# Patient Record
Sex: Female | Born: 1955 | Race: Black or African American | Hispanic: No | Marital: Single | Smoking: Former smoker
Health system: Southern US, Community
[De-identification: ages and names within clinical notes are randomized; demographics above are authoritative.]

## PROBLEM LIST (undated history)

## (undated) DIAGNOSIS — I429 Cardiomyopathy, unspecified: Secondary | ICD-10-CM

## (undated) DIAGNOSIS — K08109 Complete loss of teeth, unspecified cause, unspecified class: Secondary | ICD-10-CM

## (undated) DIAGNOSIS — E0590 Thyrotoxicosis, unspecified without thyrotoxic crisis or storm: Secondary | ICD-10-CM

## (undated) DIAGNOSIS — E1169 Type 2 diabetes mellitus with other specified complication: Secondary | ICD-10-CM

## (undated) DIAGNOSIS — D649 Anemia, unspecified: Secondary | ICD-10-CM

## (undated) DIAGNOSIS — I10 Essential (primary) hypertension: Secondary | ICD-10-CM

## (undated) DIAGNOSIS — N186 End stage renal disease: Secondary | ICD-10-CM

## (undated) DIAGNOSIS — N2581 Secondary hyperparathyroidism of renal origin: Secondary | ICD-10-CM

## (undated) DIAGNOSIS — E119 Type 2 diabetes mellitus without complications: Secondary | ICD-10-CM

## (undated) DIAGNOSIS — B1920 Unspecified viral hepatitis C without hepatic coma: Secondary | ICD-10-CM

## (undated) DIAGNOSIS — I770 Arteriovenous fistula, acquired: Secondary | ICD-10-CM

## (undated) DIAGNOSIS — E669 Obesity, unspecified: Secondary | ICD-10-CM

## (undated) DIAGNOSIS — Z972 Presence of dental prosthetic device (complete) (partial): Secondary | ICD-10-CM

## (undated) HISTORY — DX: Hypercalcemia: E83.52

## (undated) HISTORY — DX: Thyrotoxicosis, unspecified without thyrotoxic crisis or storm: E05.90

---

## 1998-05-28 ENCOUNTER — Inpatient Hospital Stay (HOSPITAL_COMMUNITY): Admission: EM | Admit: 1998-05-28 | Discharge: 1998-06-05 | Payer: Self-pay | Admitting: *Deleted

## 1998-05-28 ENCOUNTER — Encounter: Payer: Self-pay | Admitting: *Deleted

## 1998-05-29 ENCOUNTER — Encounter: Payer: Self-pay | Admitting: Infectious Diseases

## 1998-06-03 ENCOUNTER — Encounter: Payer: Self-pay | Admitting: Infectious Diseases

## 1998-06-11 DIAGNOSIS — I427 Cardiomyopathy due to drug and external agent: Secondary | ICD-10-CM | POA: Insufficient documentation

## 1998-06-23 DIAGNOSIS — F1920 Other psychoactive substance dependence, uncomplicated: Secondary | ICD-10-CM | POA: Insufficient documentation

## 1998-07-13 ENCOUNTER — Encounter: Admission: RE | Admit: 1998-07-13 | Discharge: 1998-07-13 | Payer: Self-pay | Admitting: Internal Medicine

## 1998-11-25 ENCOUNTER — Ambulatory Visit (HOSPITAL_COMMUNITY): Admission: RE | Admit: 1998-11-25 | Discharge: 1998-11-25 | Payer: Self-pay | Admitting: Nephrology

## 1998-11-25 ENCOUNTER — Encounter: Payer: Self-pay | Admitting: Nephrology

## 1998-12-02 ENCOUNTER — Ambulatory Visit (HOSPITAL_COMMUNITY): Admission: RE | Admit: 1998-12-02 | Discharge: 1998-12-02 | Payer: Self-pay | Admitting: Vascular Surgery

## 1999-04-28 ENCOUNTER — Ambulatory Visit (HOSPITAL_COMMUNITY): Admission: RE | Admit: 1999-04-28 | Discharge: 1999-04-28 | Payer: Self-pay | Admitting: Nephrology

## 1999-04-28 ENCOUNTER — Encounter: Payer: Self-pay | Admitting: Nephrology

## 2001-08-15 HISTORY — PX: CHOLECYSTECTOMY: SHX55

## 2002-08-02 ENCOUNTER — Inpatient Hospital Stay (HOSPITAL_COMMUNITY): Admission: RE | Admit: 2002-08-02 | Discharge: 2002-08-04 | Payer: Self-pay | Admitting: General Surgery

## 2002-08-02 ENCOUNTER — Encounter: Payer: Self-pay | Admitting: General Surgery

## 2002-08-02 ENCOUNTER — Encounter (INDEPENDENT_AMBULATORY_CARE_PROVIDER_SITE_OTHER): Payer: Self-pay | Admitting: *Deleted

## 2004-10-04 ENCOUNTER — Ambulatory Visit (HOSPITAL_COMMUNITY): Admission: RE | Admit: 2004-10-04 | Discharge: 2004-10-04 | Payer: Self-pay | Admitting: Nephrology

## 2005-06-06 ENCOUNTER — Ambulatory Visit: Payer: Self-pay

## 2005-08-15 HISTORY — PX: INCISIONAL HERNIA REPAIR: SHX193

## 2005-08-25 ENCOUNTER — Inpatient Hospital Stay (HOSPITAL_COMMUNITY): Admission: RE | Admit: 2005-08-25 | Discharge: 2005-09-03 | Payer: Self-pay | Admitting: General Surgery

## 2006-08-15 HISTORY — PX: AV FISTULA PLACEMENT: SHX1204

## 2006-08-15 HISTORY — PX: AV FISTULA REPAIR: SHX563

## 2006-12-30 ENCOUNTER — Ambulatory Visit: Payer: Self-pay | Admitting: *Deleted

## 2006-12-30 ENCOUNTER — Ambulatory Visit (HOSPITAL_COMMUNITY): Admission: RE | Admit: 2006-12-30 | Discharge: 2006-12-30 | Payer: Self-pay | Admitting: Nephrology

## 2007-02-09 ENCOUNTER — Ambulatory Visit: Payer: Self-pay | Admitting: Vascular Surgery

## 2007-02-14 ENCOUNTER — Ambulatory Visit: Payer: Self-pay | Admitting: Vascular Surgery

## 2007-02-14 ENCOUNTER — Ambulatory Visit (HOSPITAL_COMMUNITY): Admission: RE | Admit: 2007-02-14 | Discharge: 2007-02-14 | Payer: Self-pay | Admitting: Vascular Surgery

## 2007-04-25 ENCOUNTER — Ambulatory Visit: Payer: Self-pay | Admitting: Vascular Surgery

## 2007-05-07 ENCOUNTER — Ambulatory Visit (HOSPITAL_COMMUNITY): Admission: RE | Admit: 2007-05-07 | Discharge: 2007-05-07 | Payer: Self-pay | Admitting: Vascular Surgery

## 2007-05-08 ENCOUNTER — Ambulatory Visit: Payer: Self-pay | Admitting: Vascular Surgery

## 2007-08-01 ENCOUNTER — Ambulatory Visit: Payer: Self-pay | Admitting: Vascular Surgery

## 2007-08-01 ENCOUNTER — Ambulatory Visit (HOSPITAL_COMMUNITY): Admission: RE | Admit: 2007-08-01 | Discharge: 2007-08-01 | Payer: Self-pay | Admitting: Nephrology

## 2007-08-01 ENCOUNTER — Ambulatory Visit (HOSPITAL_COMMUNITY): Admission: RE | Admit: 2007-08-01 | Discharge: 2007-08-01 | Payer: Self-pay | Admitting: Vascular Surgery

## 2007-09-21 ENCOUNTER — Ambulatory Visit (HOSPITAL_COMMUNITY): Admission: RE | Admit: 2007-09-21 | Discharge: 2007-09-21 | Payer: Self-pay | Admitting: Nephrology

## 2007-10-19 ENCOUNTER — Emergency Department: Payer: Self-pay | Admitting: Emergency Medicine

## 2007-10-20 ENCOUNTER — Emergency Department (HOSPITAL_COMMUNITY): Admission: EM | Admit: 2007-10-20 | Discharge: 2007-10-20 | Payer: Self-pay | Admitting: Emergency Medicine

## 2007-10-23 ENCOUNTER — Inpatient Hospital Stay (HOSPITAL_COMMUNITY): Admission: EM | Admit: 2007-10-23 | Discharge: 2007-10-25 | Payer: Self-pay | Admitting: Emergency Medicine

## 2007-11-07 ENCOUNTER — Ambulatory Visit: Payer: Self-pay | Admitting: Vascular Surgery

## 2008-01-28 ENCOUNTER — Emergency Department: Payer: Self-pay | Admitting: Emergency Medicine

## 2008-04-23 ENCOUNTER — Ambulatory Visit: Payer: Self-pay | Admitting: Vascular Surgery

## 2008-05-19 ENCOUNTER — Encounter: Payer: Self-pay | Admitting: Vascular Surgery

## 2008-05-19 ENCOUNTER — Ambulatory Visit: Payer: Self-pay | Admitting: Vascular Surgery

## 2008-05-19 ENCOUNTER — Ambulatory Visit (HOSPITAL_COMMUNITY): Admission: RE | Admit: 2008-05-19 | Discharge: 2008-05-19 | Payer: Self-pay | Admitting: Vascular Surgery

## 2008-10-02 ENCOUNTER — Ambulatory Visit (HOSPITAL_COMMUNITY): Admission: RE | Admit: 2008-10-02 | Discharge: 2008-10-02 | Payer: Self-pay | Admitting: Nephrology

## 2008-10-25 ENCOUNTER — Ambulatory Visit: Payer: Self-pay | Admitting: Nephrology

## 2008-11-14 ENCOUNTER — Emergency Department: Payer: Self-pay | Admitting: Emergency Medicine

## 2008-12-23 ENCOUNTER — Ambulatory Visit: Payer: Self-pay | Admitting: Internal Medicine

## 2009-02-25 ENCOUNTER — Emergency Department: Payer: Self-pay | Admitting: Emergency Medicine

## 2010-02-24 ENCOUNTER — Ambulatory Visit (HOSPITAL_COMMUNITY): Admission: RE | Admit: 2010-02-24 | Discharge: 2010-02-24 | Payer: Self-pay | Admitting: Nephrology

## 2010-09-05 ENCOUNTER — Encounter: Payer: Self-pay | Admitting: Nephrology

## 2010-11-30 LAB — POTASSIUM: Potassium: 4.5 mEq/L (ref 3.5–5.1)

## 2010-12-28 NOTE — Op Note (Signed)
[name], [name]                ACCOUNT NO.:  [id number]   MEDICAL RECORD NO.:  [id number]          PATIENT TYPE:  AMB   LOCATION:  SDS                          FACILITY:  [facility]   PHYSICIAN:  Charles E. Fields, MD  DATE OF BIRTH:  [birth year]   DATE OF PROCEDURE:  02/14/2007  DATE OF DISCHARGE:  02/14/2007                               OPERATIVE REPORT   PROCEDURE:  Right brachiocephalic AV fistula.   PREOPERATIVE DIAGNOSIS:  End-stage renal disease.   POSTOPERATIVE DIAGNOSIS:  End-stage renal disease.   ANESTHESIA:  Local with IV sedation.   OPERATIVE FINDINGS:  3 mm right cephalic vein.   OPERATIVE DETAILS:  After obtaining informed consent, the patient was  taken to the operating room.  The patient was placed in the supine  position on the operating room table.  After adequate sedation, the  patient's right arm was inspected at the wrist to look at the cephalic  vein.  Previous vein mapping ultrasound had shown that the cephalic vein  at the wrist was tortuous and very posterior.  I confirmed this in the  OR and thought that the use of this vein would be inadequate; it was  also of marginal size.  At this point the entire right upper extremity  was prepped and draped in the usual sterile fashion.  Local anesthesia  was infiltrated near the antecubital crease.  A transverse incision was  made in this location and carried down through the subcutaneous tissues,  down to the level of the cephalic vein.  The cephalic vein was dissected  free circumferentially, and small side branches ligated and divided  between silk ties.  The brachial artery was then dissected free in the  medial portion incision.  Vessel loops were placed proximal and distal  around this.  The patient was then given 5000 units of intravenous  heparin.  The cephalic vein was ligated with a 3-0 silk tie, thoroughly  flushed with heparinized saline and gently distended.  It was then swung  over the level of the  brachial artery.  The  brachial artery was  controlled proximally and distal with vessel loops.  Longitudinal  arteriotomy was made.  The vein was then sewn end-of-vein to side-of-  artery using a running 7-0 Prolene suture.  Just prior to completion of  anastomosis, it was forebled backbled and thoroughly flushed.  Anastomosis was secured.  Vessel loops were released; there was palpable  thrill in the fistula immediately.   Next, hemostasis was obtained.  Subcutaneous tissues were reapproximated  using running 3-0 Vicryl suture.  Skin was closed with a 4-0 Vicryl  subcuticular stitch.  The patient tolerated the procedure well and there  were no complications.  Instrument, sponge and needle counts were  correct at the end of the case.  The patient was taken to the recovery  room in stable condition.      Jessy Oto. Fields, MD  Electronically Signed     CEF/MEDQ  D:  02/14/2007  T:  02/14/2007  Job:  [id number]

## 2010-12-28 NOTE — Procedures (Signed)
CEPHALIC VEIN MAPPING   INDICATION:  Evaluation of right cephalic vein prior to placement of  dialysis access.   HISTORY:  End-stage renal disease, multiple dialysis access procedures in the left  arm.   EXAM:  Duplex right arm cephalic vein.   The right cephalic vein is compressible.   Diameter measurements range from 0.3 to 0.6 cm.   The left cephalic vein was not evaluated.   IMPRESSION:  Patent right cephalic vein which is of acceptable diameter  for use as a dialysis access site.   The distal forearm portion of the right cephalic vein courses around the  radial aspect of the forearm and continues posteriorly towards the hand.    _______________________  Ruta Hinds, M.D.   ___________________________________________  Jessy Oto. Fields, MD   MC/MEDQ  D:  02/09/2007  T:  02/09/2007  Job:  [id number]

## 2010-12-28 NOTE — Discharge Summary (Signed)
Anna [name], Anna [name]                ACCOUNT NO.:  [id number]   MEDICAL RECORD NO.:  [id number]          PATIENT TYPE:  INP   LOCATION:  [id number]                         FACILITY:  [facility]   PHYSICIAN:  Donato Heinz, M.D.DATE OF BIRTH:  Nov 18, 1955   DATE OF ADMISSION:  10/23/2007  DATE OF DISCHARGE:  10/25/2007                               DISCHARGE SUMMARY   CHIEF COMPLAINT:  Shortness of breath.   DISCHARGE ACTIVITIES:  1. Shortness of breath, secondary to resolving pneumonia plus volume      overload.  2. End-stage renal disease on hemodialysis, dialysis Tuesday,      Thursday, and Saturday.  3. Right lobe pneumonia, resolving.  4. Hypertension.  5. Hepatitis C.  6. Anemia of chronic disease.  7. History of polysubstance abuse, crack cocaine.  8. Secondary hyperparathyroidism.  9. History of cardiomegaly secondary to hypertension with ejection      fraction of 45-50%.  10.Hemodialysis.  11.Questionable diabetes.   DISCHARGE MEDICATIONS:  1. PhosLo 3 tablets t.i.d. before meals.  2. Aranesp nightly.  3. Nephro-Vite daily.   DISPOSITION:  The patient will follow up with [facility]  and continue with the Tuesday, Thursday, and Saturday hemodialysis as  scheduled.  Records were faxed over to the [facility],  phone number is [phone].   STUDIES:  None.   CULTURES:  None.   CONSULTS:  None.   BRIEF HISTORY AND PHYSICAL:  [name] is a 55 year old African  American female with end-stage renal disease on HD at [facility] in  [place] who had pneumonia diagnosed in October 19, 2007, which was 56  days prior to admission.  The patient was started on azithromycin and  discharged home for this right lower lobe pneumonia.  The patient then  returned to [facility] on Saturday, which is her normal  dialysis day, two days prior to admission, because she wanted a second  opinion of this pneumonia diagnosis.  The patient's initial  presentation  was at [facility].  However, when [facility] requested  records from [facility], the patient was forced to wait.  However, these  records were obtained and decided  in this waiting time, she did not  feel it was necessary to get the second opinionafter all and left the ED  AMA.  The patient's next dialysis appointment was on the morning of  admission and normally dialyzed at around 6:30 a.m.  The patient felt  that because of her underlying shortness of breath, she could not get  ready quickly enough to make the 6:30 dialysis appointment, therefore  she missed her dialysis.  She at this point  had missed 2 dialysis  treatments.  She contacted the dialysis center.  They told her to report  to [facility] for dialysis.  The patient reported fevers, shortness of  breath, and right-sided chest pain initially when diagnosed with this  pneumonia and reports on the day of admission to the hospital shortness  of breath had improved but still present.  Right side chest pain was  still present as well, mostly pleuritic  in nature and fevers had  resolved.  The patient completed her antibiotic course on the day of  admission.  The patient had no other complaints.   PAST MEDICAL HISTORY:  As dictated above.   ADMISSION MEDICATIONS:  As dictated above.   ALLERGIES:  None.   REVIEW OF SYSTEMS:  Negative except as noted in the HPI.   ADMISSION PHYSICAL AND VITALS:  Please see dictated H&P on October 23, 2007.   HOSPITAL COURSE:  1. Shortness of breath.  I felt this shortness of breath was likely      secondary to end-stage renal disease resulting in a volume overload      as well as resolving right-sided pneumonia.  Upon presentation to      [facility] the patient was no longer febrile and had a      white count checked, which was normal at 8.8.  We do not feel there      is any need to repeat the chest x-ray and since the patient had no      further clinical  symptoms of recurrent infection, I do not feel it      is necessary to place the patient on antibiotics.  We went ahead      and dialyzed the patient, and her shortness of breath improved      some.  On the night following admission, the patient had some      right-sided back pain, which was different from the pleuritic pain      she was having that was most likely positional; however, a chest x-      ray was checked which showed bilateral pleural effusions.  No      infiltrate was noticed.  Cardiac enzymes were checked x1 and were      negative, and the EKG was checked, which showed a sinus tachycardia      with no acute changes.  This x-ray reinforced the above belief that      this shortness of breath was likely secondary to the severe      albuminemia as well as fluid overload.  The patient was offered an      extra dialysis treatment on hospital day #1 and refused, therefore      we decided to keep her and night watch her and dialyze her again on      hospital day #2.  On hospital day #2 after receiving dialysis, the      patient's shortness of breath had resolved, and the patient felt      that she was back to her baseline before discharge.2.  End stage      renal disease.  The patient had missed 2 dialysis treatments prior      to admission and seemed to be slightly volume overloaded; however,      the patient was below her dry weight, which is 108 kilos.  On      admission, the patient was 106 kilos.  It was felt that patient was      in fact not dry, but she just lost weight over this juncture      possibly secondary to her newly diagnosed pneumonia.  The patient      underwent dialysis and was continued on her Tuesday, Thursday, and      Saturday schedule.  The patient would be discharged back to      [facility] to continue on this schedule.  There were      no complications with dialysis.  2. Pneumonia.  The patient was initially diagnosed with this pneumonia       4 days prior to admission, completed a 5-day course of azithromycin      and remained afebrile with normal white count throughout this      hospital course.  Chest x-ray checked during hospitalization showed      no new infiltrate.  3. Hypertension.  The patient was initially hypertensive with a blood      pressure of 160/100.  This was likely secondary to volume overload;      however, __________ the patient's systolic of less than [id number].  We      started the patient on Norvasc 5 mg at night which she had been on      the past; however, the patient refused to take this medication.      Following dialysis, patient's blood pressures were in the [id number]      systolic, therefore we decided to discontinue this medication prior      to discharge.  The patient will remain on no blood pressure      medications at the time of discharge.  4. Diabetes.  The patient has reported diabetes; however, blood sugars      have remained in the normal range, and the patient was not placed      on the insulin and will not be discharged on any.  5. Hepatitis C.  This problem was stable.  6. Anemia of chronic disease.  The patient's hemoglobin remained      stable throughout the hospital course, and she remained on EPO or      Aranesp while here at [facility].  She will continue on      EPO at the time of discharge.  7. Secondary hyperparathyroidism.  The patient will continue on PhosLo      as well as Nephro-Vite.   DISCHARGE LABORATORY AND VITAL SIGNS:  Temperature 98.5, blood pressure  117/78, pulse 86, respiratory rate 20, saturations 93% on room air.  Labs:  Electrolytes: Sodium 135, potassium 4.2, chloride 94, bicarb 25,  BUN 52, creatinine 16.5, glucose 139, calcium 9.6.  CBC: White count  8.3, hemoglobin 11.5, and platelets 285,000.      Lincoln Maxin, MD  Electronically Signed     ______________________________  Donato Heinz, M.D.    JP/MEDQ  D:  10/25/2007  T:  10/26/2007   Job:  [id number]

## 2010-12-28 NOTE — Op Note (Signed)
Anna [name], Anna [name]                ACCOUNT NO.:  [id number]   MEDICAL RECORD NO.:  [id number]          PATIENT TYPE:  AMB   LOCATION:  SDS                          FACILITY:  [facility]   PHYSICIAN:  Charles E. Fields, MD  DATE OF BIRTH:  1955-12-01   DATE OF PROCEDURE:  05/19/2008  DATE OF DISCHARGE:  05/19/2008                               OPERATIVE REPORT   PROCEDURE:  Excisional biopsy of right chest wall nodule.   PREOPERATIVE DIAGNOSIS:  Right-sided chest pain.   POSTOPERATIVE DIAGNOSIS:  Right-sided chest pain.   ANESTHESIA:  Local with IV sedation.   INDICATIONS:  The patient is a 55 year old female who previously had a  right-sided Diatek catheter and has developed a nodule scar-type lesion  on the right chest wall after the catheter has been removed.  She  reports that this nodule is painful to her.  She presents today for  excisional biopsy of this.   SPECIMENS:  Excisional biopsy of right chest wall.   OPERATIVE DETAIL:  After obtaining informed consent, the patient was  taken to the operating room.  The patient was placed in supine position  on the operating table.  After adequate sedation, the patient's entire  right anterior chest wall was prepped and draped in the usual sterile  fashion.  Local anesthesia was infiltrated around the nodule on the  right chest wall.  An elliptical incision was made around this nodule  and carried down through subcutaneous tissue down underneath the level  of the nodule.  This was then completely excised and sent to pathology  as a specimen.  This was transected in the central portion, and no  obvious foreign body was noticed.  Next, hemostasis was obtained with  cautery.  Subcutaneous tissues were reapproximated using running 3-0  Vicryl suture.  Skin was closed with a 4-0 Vicryl subcuticular stitch.  The patient tolerated the procedure well, and there were no  complications.  Instruments, sponge, and needle counts were correct at  the end of the case.  The patient was taken to the recovery room in  stable condition.      Jessy Oto. Fields, MD  Electronically Signed     CEF/MEDQ  D:  05/19/2008  T:  05/20/2008  Job:  [id number]

## 2010-12-28 NOTE — Assessment & Plan Note (Signed)
OFFICE VISIT   VALOIS, PALMITER A  DOB:  [birth year]                                       04/25/2007  [id number]   Patient returns for followup today after placement of a right  brachiocephalic AV fistula in July, 2008.   On exam today, the fistula has an easily palpable thrill.  She does have  an obese upper arm, but the proximal segment of this is easily palpable.   She has a known thrombosed left upper arm AV fistula and is requesting  today that we remove this so that the bulge in her arm is not so  prominent.  She is currently dialyzing by a right-sided catheter.  I  believe that they should be able to use the right-sided fistula at any  time.  I wrote her a note today regarding this.  As far as her  aneurysmal occluded fistula is concerned, I told her that it would take  several incisions to remove this, and certainly we could ligate the  fistula and remove as much of the old thrombus as possible.  She will  probably still be left with some scars in the arm, but she wishes to  have this removed as much as possible.  We have scheduled this for  May 02, 2007.   Jessy Oto. Fields, MD  Electronically Signed   CEF/MEDQ  D:  04/25/2007  T:  04/26/2007  Job:  [id number]   cc:   Maudie Flakes. Hassell Done, M.D.

## 2010-12-28 NOTE — Assessment & Plan Note (Signed)
OFFICE VISIT   Anna Anna [name], Anna Anna [name]  DOB:  1956/08/04                                       02/09/2007  [id number]   The patient is Anna [name] longterm hemodialysis access patient.  She has  previously been using the left upper arm AV fistula for the last 9  years.  This developed aneurysmal degeneration and eventually occluded.  She is currently using Anna [name] right-sided catheter.  She has been using this  for the last 3 weeks.  She is currently 55 years of age.  She is right-  handed.  She has fairly obese upper arms.   PHYSICAL EXAMINATION:  Blood pressure is 119/82, heart rate is 85 and  regular.  She has obese upper arms but there is Anna [name] cephalic vein at the  antecubital space.  She has 2+ brachial and radial pulses bilaterally.  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   MEDICATIONS:  1. PhosLo 660 mg three times Anna [name] day.  2. Rena-Vite once Anna [name] day.  3. Multivitamin once Anna [name] day.   States that she  had hypertension in the past but has not had any  recently.  She had Anna [name] vein mapping ultrasound today which showed that the  vein in her forearm is quite tortuous but approximately 3-mm in  diameter.  The upper arm vein is much straighter and varies between 3  and 6-mm in diameter.   I believe the patient might be suitable for Anna [name] radiocephalic AV fistula.  However this will depend on the lay of the vein and we will assess this  again at the time of operation.  We may actually explore this prior to  placing the fistula.  If the wrist is an unacceptable location we will  place Anna [name] brachiocephalic fistula at the same time.  We have scheduled her  fistula placement for February 14, 2007.   Jessy Oto. Fields, MD  Electronically Signed   CEF/MEDQ  D:  02/12/2007  T:  02/12/2007  Job:  [id number]   cc:   [facility]

## 2010-12-28 NOTE — H&P (Signed)
NAMEDENISS, FREUNDLICH NO.:  [id number]   MEDICAL RECORD NO.:  [id number]          PATIENT TYPE:  INP   LOCATION:  [id number]                         FACILITY:  [facility]   PHYSICIAN:  Lincoln Maxin, MD    DATE OF BIRTH:  November 14, 1955   DATE OF ADMISSION:  10/23/2007  DATE OF DISCHARGE:                              HISTORY & PHYSICAL   HISTORY OF PRESENT ILLNESS:  This 55 year old African American female  with end-stage renal disease on Tuesday, Thursday, Saturday hemodialysis  at [facility] with pneumonia diagnosed on October 19, 2007.  Patient is  presenting to the ED after missing 2 HC treatments secondary to  shortness of breath from pneumonia.  Symptoms started up March 6 with  shortness of breath, right-sided chest pain, rib pain and fever.  Patient had full work up at [facility] , sent home on  azithromycin and treated for 5 days.  Patient came to [facility] on March 7, one day after being diagnosed at [facility]  because she wanted a second opinion.  Patient arrived in the ED at  [facility] and was promptly seen; however, records show  [facility] requested, and patient reports that she was waiting for 3 hours  without receiving any kind of treatment.  Patient, however, had refused  chest x-ray and other work up.  Instead of waiting until those records  came, patient decided that the second opinion was no longer important  and left AMA from the ED.  Patient was, however, febrile; 101.4 when she  arrived in the ED at [facility].  Patient presents today in the ED and  feels that shortness of breath and right-sided pain are improved.  The  fevers have since resolved.  Patient does get shortness of breath when  moving around and doing activities.  Patient reports this morning she  normally dialyses at 6:30 in the morning.  However, because of this  continued shortness of breath, she was unable to get ready fast enough  to make the 6:30  appointment.  She called the dialysis center who  advised the patient to come to ED at [facility] for HD.  Patient was  without other complaint.  She completed her azithromycin course today  and feels as though the pneumonia is improving.   PAST MEDICAL HISTORY:  1. End-stage renal disease on hemodialysis at [facility]      Tuesday, Thursday, Saturday.  2. Pneumonia, resolving.  3. Hypertension.  4. Question of diabetes.  5. Hepatitis C.  6. Anemia of chronic disease.  7. History of polysubstance abuse.  8. Secondary hyperparathyroidism.  9. History of cardiomegaly secondary to hypertension with ejection      fraction of 45% to 50%.  10.Hemorrhoids.  11.Status post laparoscopic cholecystectomy  2003.   ADMISSION MEDICATIONS:  1. Phos-Lo 7 mg 3 tabs t.i.d. before meals.  2. Nephro-Vite 1 tab daily.   ALLERGIES:  No known drug allergies.   FAMILY HISTORY:  Noncontributory.   SOCIAL HISTORY:  Lives at home with her children.  Denies alcohol,  tobacco, or current  drug use.   REVIEW OF SYSTEMS:  Negative except for as noted in the HPI.   PHYSICAL EXAMINATION:  VITALS:  Temperature 98.2.  Pulse 85.  Respiration rate 18.  Blood pressure 160/101.  O2 91% on room air.  GENERAL:  Patient in no acute distress.  Alert and oriented x3.  EYES:  Pupils equally reactive to light and accommodation.  Extraocular  movements intact.  Anicteric.  ENT:  Oropharynx clear.  No pharyngeal erythema or exudate.  Moist  mucous membranes.  CARDIOVASCULAR:  Regular rate and rhythm.  No murmurs, rubs or gallops.  PULMONARY:  Diminished breath sounds throughout.  Patient refuses to  take deep inspiration secondary to right rib pain.  With inspiration,  however, no apparent wheezing, rubs, or rhonchi.  GASTROINTESTINAL:  Abdomen is very obese, however it is soft, nontender.  Positive bowel sounds.  There is an umbilical hernia scar.  EXTREMITIES:  No edema.  No clubbing.  NEUROLOGIC:  Cranial  nerves appear intact.  Patient is nonfocal.   No labs on admission; however, electrolytes and CBC were pending.   ASSESSMENT AND PLAN:  1. End-stage renal disease.  Patient will be admitted to the hospital      and receive dialysis.  We will continue her on a Tuesday, Thursday,      Saturday schedule.  2. Pneumonia.  Patient is status post azithromycin for 5 days.      Symptomatically improved.  No need to repeat the chest x-ray.  This      is likely still resolving.  Patient is afebrile.  White blood cell      count pending and her O2 Sats are greater than 90%.  No reason to      believe that pneumonia is persisting as all clinical markers point      to its resolution.  3. Hypertension.  Patient's sytolic blood pressure [id number].  She has a      history of hypertension, however is not on any medications on      admission.  Patient has been on lisinopril in the past, and her      goal systolic blood pressure is less than 140.  We will start her      on a low dose of Norvasc at night for improved blood pressure      control.  4. Diabetes.  Patient has a past medical history of reported diabetes;      however, she is unclear for this diagnosis and is on no medications      currently.  We will wait and obtain the patient's blood sugar from      her BMP.  Start the patient on a consistent carb diet.  5. Hepatitis C.  This is controlled.  LFT is pending.  6. Anemia of chronic disease.  Patient's hemoglobin pending.  7. History of polysubstance abuse.  Patient denies current use and has      history of crack cocaine use in the past.  8. Secondary hyperparathyroidism.  Patient  is on Phos-Lo and will      continue.      Lincoln Maxin, MD  Electronically Signed     JP/MEDQ  D:  10/23/2007  T:  10/23/2007  Job:  [id number]

## 2010-12-28 NOTE — Assessment & Plan Note (Signed)
OFFICE VISIT   Anna [name], Anna [name] A  DOB:  1956-07-08                                       04/23/2008  [id number]   The patient is a 55 year old female who currently is on hemodialysis on  Tuesday, Thursday, Saturday.  She was sent for evaluation today for pain  around an old Diatek site.  She has had no fever, chills or drainage  from this.  She stated that it was red recently but this has resolved.  She states she has had pain in this site for a year since her Diatek was  removed.   On physical exam there is a hypertrophic scar on her right anterior  chest wall.  There is no erythema.  No discharge.  This is tender to  palpation.  Of note, she also has a right brachiocephalic AV fistula.  They have been sticking a segment that is only 2 cm in length because  they said they have difficulty sticking it higher than this.  I did  counsel the patient today that if they continue to stick in the same  small area the fistula is not going to be very durable or last very  long.  Unfortunately her drain is fairly deep in the upper arm but it is  easily palpable on exam.  She states that certain techs are able to  cannulate it but others are not.  I have counseled her that she should  try to rotate the stick sites as much as possible.  If they are  continuing to have difficulty cannulating this then some consideration  should be made for converting it to a graft as this will not be a  durable fistula with these stick sites only occurring every 2 cm segment  of the proximal fistula.   As far as the area on her right anterior chest is concerned I do not see  any area of infection.  She has had pain in this area for greater than a  year.  I believe the best option would be to excise this area.  She may  have a small area of retained cuff.  However, I did counsel her today  that all of her pain symptoms may not resolve but this would probably be  the best option to  make sure she does not have any retained segments.  This is scheduled for Monday 04/28/2008.   Jessy Oto. Fields, MD  Electronically Signed   CEF/MEDQ  D:  04/23/2008  T:  04/25/2008  Job:  [id number]   cc:   [facility]

## 2010-12-28 NOTE — Op Note (Signed)
Anna [name], Anna [name]                ACCOUNT NO.:  [id number]   MEDICAL RECORD NO.:  [id number]          PATIENT TYPE:  AMB   LOCATION:  XRAY                         FACILITY:  [facility]   PHYSICIAN:  Dorothea Glassman, M.D.    DATE OF BIRTH:  Mar 22, 1956   DATE OF PROCEDURE:  12/30/2006  DATE OF DISCHARGE:                               OPERATIVE REPORT   SURGEON:  P Cameron Sprang, MD   ASSISTANT:  Nurse.   ANESTHETIC:  Local MAC.   PREOPERATIVE DIAGNOSIS:  End-stage renal failure.   POSTOPERATIVE DIAGNOSES:  End-stage renal failure.   PROCEDURE:  Ultrasound-guided right internal jugular Diatek catheter.   OPERATIVE PROCEDURE:  The patient brought to the operating room in  stable condition.  Placed supine position.  Right neck prepped and  draped in sterile fashion.  Ultrasound of the right neck revealed right  laterally positioned right internal jugular vein which was normal in  caliber.   Skin and subcutaneous tissues instilled with 1% Xylocaine.  A needle  easily introduced to right internal jugular vein, 0.035 J-wire passed  through the needle into the superior vena cava.  Site opened 11 blade.  12, 14, and 16 dilators advanced over guidewire with fluoroscopy  control.  16 dilator and tearaway sheath advanced over guidewire.  Dilator and guidewire removed.  Diatek catheter placed through the  sheath to the superior vena cava right atrial junction.  The tearaway  sheath removed.  Subcutaneous tunnel created.  Catheter brought through  the tunnel.  The catheter divided, hub mechanism assembled.  Flushed  with heparin saline solution capped with heparin.  Insertion site closed  with interrupted 4-0 nylon.  Catheter fixed to skin with interrupted 2-0  silk suture.  Sterile dressings applied.  No apparent complications.  The patient transferred to recovery in stable condition., chest x-ray  ordered.      Dorothea Glassman, M.D.  Electronically Signed     PGH/MEDQ  D:  12/31/2006   T:  01/01/2007  Job:  [id number]

## 2010-12-28 NOTE — Assessment & Plan Note (Signed)
OFFICE VISIT   Anna [name], Anna [name]  DOB:  12/27/1955                                       11/07/2007  [id number]   [name] returns today for evaluation of her right brachiocephalic AV  fistula.  Apparently, they are having [name] difficult time accessing this  and have had multiple infiltrations.  She has [name] very obese upper arm.  She apparently had [name] fistulogram performed in February which showed one  large competing branch, however, the fistula itself is probably at least  3 cm from the skin surface.  Fistula was placed in July of 2008.   EXAM:  Today, she has an easily palpable thrill in her right  brachiocephalic AV fistula.  The proximal 3 cm of the fistula are easily  palpable and easily visible on the skin surface.  However, the fistula  becomes deeper under [name] large amount of subcutaneous and adipose tissue  in the upper arm.   Although she does have [name] competing branch in this fistula, I do not  believe that the problems cannulated are related to this.  It is more  likely that it is due to the depth of the fistula due to her obese upper  arm.  I believe the only reasonable option for her, if they are having  trouble with this fistula, would be placement of [name] right forearm AV  graft.  I do not believe ligation of the competing branch is going to  fix the underlying problem.  I discussed this with [name] today.  However, she is refusing to have [name] graft placed because she is worried  it will occlude.  I had [name] lengthy discussion with her today that  although the fistula she has should be durable and last [name] long time it  is not necessarily [name] good fistula if they cannot stick it successfully.  She currently is adamant that she does not want to have [name] graft placed  at this time.  I informed her that she could follow up on an as-needed  basis; if she wishes to have [name] graft placed at some point in the future  we would certainly consider that then.  I  have not scheduled her for  further followup.   Jessy Oto. Fields, MD  Electronically Signed   CEF/MEDQ  D:  11/07/2007  T:  11/08/2007  Job:  [id number]   cc:   Maudie Flakes. Hassell Done, M.D.

## 2010-12-31 NOTE — Discharge Summary (Signed)
NAMEBRADLEIGH, Anna [name]                ACCOUNT NO.:  [id number]   MEDICAL RECORD NO.:  [id number]          PATIENT TYPE:  INP   LOCATION:  5743                         FACILITY:  [facility]   PHYSICIAN:  Merri Ray. Grandville Silos, M.D.DATE OF BIRTH:  1956/07/01   DATE OF ADMISSION:  08/25/2005  DATE OF DISCHARGE:  09/03/2005                                 DISCHARGE SUMMARY   DISCHARGE DIAGNOSES:  1.  End-stage renal disease.  2.  Abdominal incisional hernia.  3.  Status post laparoscopic repair abdominal incisional hernia.   HISTORY OF PRESENT ILLNESS:  The patient is a 55 year old African-American  female who was brought in for elective laparoscopic repair of incisional  abdominal hernia.   HOSPITAL COURSE:  The patient underwent an uncomplicated laparoscopic hernia  repair. Postoperatively, she remained hemodynamically stable. Renal service  followed her along and managed her dialysis. She was slow to mobilize and  was requiring some pain medication. PT evaluation was obtained. She  gradually increased her activity and began walking around. Her hypertension  remained quite well controlled. She developed a fever of August 31, 2005.  White blood cell count remained normal. It seemed to be isolated. Blood  cultures remained negative. She began moving her bowels well. No further  fever never recurred. She increased her activity to level she was able to  manage at home and home health physical therapy was set up. She was  discharged home on postoperative day #9 after hemodialysis on September 05, 2005.   DISCHARGE DIET:  Renal.   DISCHARGE ACTIVITIES:  No lifting and to wear abdominal binder for comfort.  Follow up in two weeks with myself.      Merri Ray Grandville Silos, M.D.  Electronically Signed     BET/MEDQ  D:  11/03/2005  T:  11/03/2005  Job:  [id number]

## 2010-12-31 NOTE — Consult Note (Signed)
Anna [name], Anna [name]                ACCOUNT NO.:  [id number]   MEDICAL RECORD NO.:  [id number]          PATIENT TYPE:  INP   LOCATION:  [id number]                         FACILITY:  [facility]   PHYSICIAN:  Donato Heinz, M.D.DATE OF BIRTH:  07-18-1956   DATE OF CONSULTATION:  08/25/2005  DATE OF DISCHARGE:                                   CONSULTATION   REASON FOR CONSULTATION:  Hypertension, diabetes, and end-stage renal  disease.   HISTORY OF PRESENT ILLNESS:  Miss Karwowski is a 55 year old African-American  female with past medical history significant for end-stage renal disease  secondary to ongoing hypertension who has been on dialysis since June 03, 1998. She was admitted by [facility] after an elective repair  of her incisional hernias. She has been doing well postoperatively. I have  been asked to help manage her end-stage renal disease and other medical  issues. She did have mesh placed during laparoscopic surgery. She is  currently awake, in some discomfort but stable.   ALLERGIES:  She has no known drug allergies.   PAST MEDICAL HISTORY:  1.  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 Started on      dialysis on June 03, 1998.  2.  Malignant hypertension.  3.  History of substance abuse, mainly crack. Last known use was May 28, 1998.  4.  History of hep C positive.  5.  History of cardiomyopathy secondary to hypertension with an EF of 45 to      50%.  6.  Hemorrhoids.  7.  Status post laparoscopic cholecystectomy August 02, 2002.  8.  Anemia.  9.  Secondary hyperthyroidism on Hectorol 2 mcg a day.   CURRENT MEDICATIONS:  1.  Nephrovit 1 a day.  2.  PhosLo 667, three with each meal.  3.  Lisinopril 20 mg a day.   FAMILY HISTORY:  Noncontributory.   SOCIAL HISTORY:  She lives at home with 3 of her children. She denies  tobacco use, alcohol, or drug use.   REVIEW OF SYSTEMS:  GENERAL: She has some abdominal pain but no  nausea or  vomiting.  OPHTHALMIC: No blurry vision.  CARDIAC: No chest pain, palpitations, orthopnea, PND.  PULMONARY: No shortness of breath, hemoptysis, or productive cough.  GI: No nausea, vomiting, hematochezia. No bright red blood per rectum. Does  have some incisional abdominal pain.  GU: No dysuria, polyuria, hematuria, urgency, frequency, or retention.   All other systems negative.   PHYSICAL EXAM:  GENERAL: She is a well-developed obese female in no acute  distress.  VITAL SIGNS: Temperature is 98.2, pulse 75, blood pressure 152/74,  respiratory rate 11.  HEENT EXAM: Normocephalic and atraumatic. Pupils equally round and reactive  to light. Extraocular muscles intact. No icterus. Oropharynx without  lesions.  LUNGS: Clear to auscultation and percussion bilaterally. No rales or  rhonchi.  CARDIAC EXAM: Regular rate and rhythm. No precordial rub appreciated.  ABDOMINAL EXAM: Decreased bowel sounds. It is tender to palpation. No  guarding or rebound.  EXTREMITIES: No clubbing, cyanosis, or edema. She has  a left upper extremity  graft with a palpable thrill and audible bruits.   LABS:  Her potassium was 3.9.   ASSESSMENT AND PLAN:  1.  Incisional hernias. She is status post laparoscopic repair of her      ventral incisional hernias and is recovering in the PACU.  2.  End-stage renal disease. Normally she is on a Tuesday-Thursday-Saturday      schedule at [facility] with an estimated dry weight of      111.5 kilograms. Will plan to perform dialysis tomorrow, Friday, and      then get her back on schedule on Tuesday-Thursday-Saturday.  3.  Hypertension. Blood pressure is stable. Continue with lisinopril.  4.  Anemia. Will increase her EPO to 5,000 units from 3,700 because of the      minimal amount of blood loss from surgery.  5.  Secondary hyperparathyroidism. Will continue with Hectorol and resume      PhosLo when her diet has been advanced.   Will continue  to follow along. Thank you for the excellent care of this  patient.           ______________________________  Donato Heinz, M.D.     JC/MEDQ  D:  08/25/2005  T:  08/26/2005  Job:  [id number]

## 2010-12-31 NOTE — Op Note (Signed)
Anna [name], Anna [name]                ACCOUNT NO.:  [id number]   MEDICAL RECORD NO.:  [id number]          PATIENT TYPE:  AMB   LOCATION:  SDS                          FACILITY:  [facility]   PHYSICIAN:  Charles E. Fields, MD  DATE OF BIRTH:  02-24-1956   DATE OF PROCEDURE:  05/08/2007  DATE OF DISCHARGE:  05/07/2007                               OPERATIVE REPORT   PROCEDURE:  1. Ligation of left brachiocephalic arteriovenous fistula.  2. Resection of fistula pseudoaneurysm.   PREOPERATIVE DIAGNOSIS:  Large pseudoaneurysm left brachiocephalic  arteriovenous fistula.   POSTOPERATIVE DIAGNOSIS:  Large pseudoaneurysm left brachiocephalic  arteriovenous fistula.   ANESTHESIA:  General.   ASSISTANT:  Nurse.   OPERATIVE DETAIL:  After obtaining informed consent, the patient taken  to the operating room.  The patient placed supine position operating  table.  After induction of general anesthesia and placement laryngeal  mask the patient's entire left upper extremity prepped and draped usual  sterile fashion.  Transverse incision was made through a preexisting  scar near the antecubital crease.  Incision was carried down through  subcutaneous tissues down level of fistula.  The fistula had pulsatile  flow at this location.  It was dissected free circumferentially down to  the level of brachial artery.  At this level was doubly ligated with 2-0  silk tie and divided.  There was still good Doppler flow in the radial  ulnar and brachial arteries at this point.  Next, through three counter  incisions on the arm, I dissected a large thrombus filled pseudoaneurysm  free circumferentially.  The distal end was to a portion of cephalic  vein which was thrombosed and approximately 5 cm in diameter.  This was  dissected free circumferentially and ligated also.  Remainder of  pseudoaneurysm was dissected free from the surrounding subcutaneous  tissues.  Pseudoaneurysm was then completely removed.  Next  the wounds  were thoroughly irrigated normal saline.  All four incisions had the  subcutaneous tissues reapproximated using running 3-0 Vicryl suture.  Skin of all four incisions closed with 4-0 Vicryl subcuticular stitch.  The patient tolerate procedure well and there were no complications.  Instrument, sponge, needle counts correct at end the case.  The patient  taken to the recovery room in stable condition.      Jessy Oto. Fields, MD  Electronically Signed     CEF/MEDQ  D:  05/08/2007  T:  05/08/2007  Job:  [id number]

## 2010-12-31 NOTE — Op Note (Signed)
Anna [name], Anna [name]                            ACCOUNT NO.:  [id number]   MEDICAL RECORD NO.:  [id number]                   PATIENT TYPE:  OIB   LOCATION:  2854                                 FACILITY:  [facility]   PHYSICIAN:  Merri Ray. Grandville Silos, M.D.             DATE OF BIRTH:  March 15, 1956   DATE OF PROCEDURE:  08/02/2002  DATE OF DISCHARGE:                                 OPERATIVE REPORT   PREOPERATIVE DIAGNOSIS:  Symptomatic cholelithiasis.   POSTOPERATIVE DIAGNOSIS:  Symptomatic cholelithiasis.   PROCEDURE:  Laparoscopic cholecystectomy with intraoperative cholangiogram.   SURGEON:  Merri Ray. Grandville Silos, M.D.   ASSISTANT:  Shellia Carwin, M.D.   ANESTHESIA:  General.   FINDINGS:  An intraoperative cholangiogram with no filling defects in the  common bile duct.   CLINICAL NOTE:  The patient is a 55 year old African-American female with a  history of hypertension and end-stage renal disease, who has recently been  having right upper quadrant pain.  Her workup showed cholelithiasis, and she  presents for elective cholecystectomy.   DESCRIPTION OF PROCEDURE:  The patient was brought to the main operating  room.  She had received intravenous antibiotics, and general anesthesia was  administered.  Her abdomen was prepped and draped in a sterile fashion and a  midline supraumbilical incision was made.  Subcutaneous tissues were  dissected down, revealing the anterior fascia, which was divided sharply.  Subsequently the peritoneal cavity was entered without difficulty and a 0  Vicryl pursestring suture was placed around the abdominal wall fascia and  the Hasson trocar was placed.  The abdomen was insufflated with carbon  dioxide in the standard fashion and then three more ports were placed under  direct vision.  There was a 10 mm epigastric port and two 5 mm lateral  ports.  Subsequently the dome of the gallbladder was retracted  superomedially and the infundibulum was retracted  inferolaterally.  Her  liver was quite large and hung down a little bit low.  Next the dissection  of the cystic duct started laterally and progressed medially.  The cystic  duct was easily located and was encircled, and the dissection continued  until there was a large window between the cystic duct, the infundibulum of  the gallbladder, and the liver.  Subsequently a clip was placed on the  infundibulo-cystic duct junction.  A small nick was made in the cystic duct,  and a Reddick cholangiogram catheter was inserted.  Subsequently an  intraoperative cholangiogram was shot, demonstrating no filling defects in  the common bile duct.  There was good visualization of the biliary tree and  of the cystic duct.  Subsequently the cholangiogram catheter was removed.  The cystic duct was clipped three times proximally and divided.  Subsequently further dissection revealed the cystic artery superiorly and  then several small veins and lymphatic structures inferiorly.  First the  veins and lymphatic structures  were clipped twice proximally, once distally,  and divided.  Then the cystic artery was clipped three times proximally, one  time distally, and divided.  Subsequently the gallbladder was taken off of  the liver bed with the Bovie cautery.  Several areas on the liver bed were  cauterized using the Bovie to get excellent hemostasis.  Once the  gallbladder was removed, it was placed in an EndoCatch bag and removed from  the abdomen through the supraumbilical incision.  There were large stones  palpable in the gallbladder.  At this time the liver was retracted  superiorly to expose the liver bed.  A few more areas of bleeding were  cauterized using the Bovie cautery until excellent hemostasis was obtained.  Once this was accomplished, the abdomen was copiously irrigated.  The  gallbladder bed was rechecked and noted to be dry.  The residual irrigant  fluid was evacuated, and it was clear.  The  three ports were removed under  direct vision, the pneumoperitoneum was released, and the Hasson trocar was  removed and the supraumbilical fascia was closed by tying the 0 Vicryl  pursestring suture.  All the wounds were irrigated and then closed with a  running 4-0 Vicryl subcuticular stitch.  Benzoin and Steri-Strips and  sterile dressings were applied.  Sponge and needle counts and instrument  counts were correct.  The patient tolerated the procedure well without any  apparent complications and was taken to the recovery room in stable  condition.                                               Merri Ray Grandville Silos, M.D.    BET/MEDQ  D:  08/02/2002  T:  08/03/2002  Job:  [id number]

## 2010-12-31 NOTE — Op Note (Signed)
Anna, [name] NO.:  [id number]   MEDICAL RECORD NO.:  [id number]          PATIENT TYPE:  INP   LOCATION:  2899                         FACILITY:  [facility]   PHYSICIAN:  Merri Ray. Grandville Silos, M.D.DATE OF BIRTH:  [birth year]   DATE OF PROCEDURE:  08/25/2005  DATE OF DISCHARGE:                                 OPERATIVE REPORT   PREOPERATIVE DIAGNOSIS:  Ventral incisional hernia x2.   POSTOPERATIVE DIAGNOSIS:  Ventral incisional hernia x2.   PROCEDURE:  Laparoscopic repair of ventral incisional hernias with mesh.   SURGEON:  Georganna Skeans.   ASSISTANT:  [employer].   ANESTHESIA:  General.   HISTORY OF PRESENT ILLNESS:  Ms. Fregoso is a 55 year old African-American  female with history of end-stage renal disease who underwent a laparoscopic  cholecystectomy in December 2003.  She has a previous lower midline incision  for tubal ligation.  CT scan that demonstrated some areas of herniation  through these incisions involving fat and she presents for laparoscopic  repair.  We made arrangements preoperatively for her to be followed on the  renal floor and to receive her dialysis via Dr. Cherlyn Cushing service.   PROCEDURE IN DETAIL:  Informed consent was obtained.  The patient received  intravenous antibiotics. She was identified in the preoperative holding  area.  Her site was marked. She is brought to the operating room. General  anesthesia was administered. Her abdomen was prepped and draped in sterile  fashion.  Subsequently an Ioban drape was placed across the abdomen. The  transverse incision was made and the superior left lower quadrant.  Subcutaneous tissues were dissected down. The anterior fascia was divided.  Pursestring suture was placed around the anterior fascia of 0 Vicryl.  Further dissection went through the posterior fascia and then under direct  vision we carefully entered the peritoneal cavity without difficulty.  Hasson trocar was inserted. The  abdomen was insufflated with carbon dioxide  in standard fashion. The hernias were readily apparent. Very small one  superiorly along the entrance of her falciform ligament to the anterior  abdominal wall. The second periumbilical and then third one being more  infraumbilical.  Under direct vision a 5 mm right lower quadrant and a 10 mm  right mid abdominal port were placed, both well lateral under direct vision.  Attention was first directed to the most superior hernia which appeared at  the entrance to the abdominal wall at her falciform ligament.  The falciform  was clipped twice superiorly and inferiorly and divided with cautery. The  small hernia contents were reduced and it contained only a small piece of  omentum.  Once that was cleared away, we directed attention to the  periumbilical defect. This again contained only omentum which was brought  out gradually.  Bovie cautery was used to maintain excellent hemostasis.  Once that defect was cleared, we progressed further down to the  infraumbilical defect which was the largest. The defect itself was actually  to the left side although the omentum herniated up into a subcutaneous  pocket extending over to the right lower quadrant.  This omentum was reduced  without incident.  Hemostasis was maintained during dissection with Bovie  cautery. No bowel was involved as expected in light of her CAT scan.  Once  these were removed, hemostasis was ensured and the omentum.  We then  measured out the size of mesh needed to adequately give least 4 cm of  overlap past each defect in all directions.  We selected a 20 x 24  centimeter sheet of  Parietex mesh. This was trimmed according to our  markings and then annotated for orientation, once it was to be placed into  the abdomen. Eight sutures of 0 Novofil were placed one each at the 12  o'clock, 3 o'clock, 6 o'clock and 9 o'clock position and in one each half  way in between.  The mesh was rolled up  and inserted into the abdomen.  Prior to insertion of the mesh it was determined we wanted to move the left-  sided Hasson in order to give more coverage of the central defect and so a  left upper quadrant 11 mm trocar was inserted under direct vision. The  fascia from the Hasson was closed by first tying the pursestring suture of 0  Vicryl and placing a second figure-of-eight 0 Vicryl suture.  That created a  secure closure. We then rolled up and inserted the mesh.  Small nicks were  then made overlying each or our eight suture points and using the EndoCatch,  sutures were brought out with a bridge of fascia in between  circumferentially. Once this was accomplished, the mesh was pulled up and  under direct vision against the anterior abdominal wall, was noted to be in  good position and each suture was tied securely and then trimmed below the  skin line.  The mesh was then secured under direct vision with the  Endotacker.  A peripheral ring of 5 mm tacks was applied and subsequently a  second concentric ring closer in but still on the fascia was applied nicely  adhering the mesh to the anterior abdominal wall. Please note that the film  side of the Parietex mesh was left towards the bowel. Once this was  accomplished with excellent fixation, the abdomen was inspected. There was  no bleeding.  Ports were removed under direct vision. The pneumoperitoneum  was released and we watched the mesh laid down over the bowel under direct  vision and a 11 mm port was removed.  Please note that quarter percent  Marcaine with epinephrine was used at all port sites.  The skin of each port  site was closed with running 4-0 Monocryl subcuticular stitch and Benzoin  and Steri-Strips were used to finish those incisions. Benzoin and Steri-  Strips were also placed and used to close our eight stab wounds for the sutures. The sterile dressings were applied.  Sponge, needle and instrument  counts were correct  again. The patient tolerated procedure well without  apparent complication was taken recovery in stable condition.      Merri Ray Grandville Silos, M.D.  Electronically Signed     BET/MEDQ  D:  08/25/2005  T:  08/25/2005  Job:  [id number]   cc:   Maudie Flakes. Hassell Done, M.D.  Fax: [phone]

## 2011-05-09 LAB — CBC
Hemoglobin: 11.5 — ABNORMAL LOW
MCHC: 33.3
MCHC: 33.4
MCV: 90.5
MCV: 90.7
RBC: 3.79 — ABNORMAL LOW
RDW: 16.5 — ABNORMAL HIGH
WBC: 8.3

## 2011-05-09 LAB — BASIC METABOLIC PANEL
BUN: 90 — ABNORMAL HIGH
CO2: 23
Calcium: 9.6
Chloride: 91 — ABNORMAL LOW
Creatinine, Ser: 21.84 — ABNORMAL HIGH
GFR calc Af Amer: 2 — ABNORMAL LOW
Glucose, Bld: 139 — ABNORMAL HIGH
Glucose, Bld: 97
Potassium: 5.8 — ABNORMAL HIGH
Sodium: 135

## 2011-05-09 LAB — CARDIAC PANEL(CRET KIN+CKTOT+MB+TROPI)
CK, MB: 1.3
Total CK: 129

## 2011-05-16 LAB — POCT I-STAT 4, (NA,K, GLUC, HGB,HCT)
HCT: 34 — ABNORMAL LOW
Sodium: 134 — ABNORMAL LOW

## 2011-05-20 LAB — CATH TIP CULTURE

## 2011-05-26 LAB — POCT I-STAT 4, (NA,K, GLUC, HGB,HCT)
Glucose, Bld: 105 — ABNORMAL HIGH
Operator id: 274481

## 2011-05-31 LAB — POCT I-STAT 4, (NA,K, GLUC, HGB,HCT)
HCT: 38
Hemoglobin: 12.9
Potassium: 3.7
Sodium: 135

## 2011-08-16 HISTORY — PX: AV FISTULA PLACEMENT: SHX1204

## 2011-08-31 ENCOUNTER — Emergency Department: Payer: Self-pay | Admitting: *Deleted

## 2011-08-31 LAB — COMPREHENSIVE METABOLIC PANEL
Albumin: 3.4 g/dL (ref 3.4–5.0)
Alkaline Phosphatase: 98 U/L (ref 50–136)
Anion Gap: 16 (ref 7–16)
Calcium, Total: 9.8 mg/dL (ref 8.5–10.1)
Co2: 27 mmol/L (ref 21–32)
Creatinine: 11.57 mg/dL — ABNORMAL HIGH (ref 0.60–1.30)
Glucose: 259 mg/dL — ABNORMAL HIGH (ref 65–99)
Potassium: 4 mmol/L (ref 3.5–5.1)
SGOT(AST): 20 U/L (ref 15–37)
SGPT (ALT): 20 U/L

## 2011-08-31 LAB — CBC
HGB: 11.6 g/dL — ABNORMAL LOW (ref 12.0–16.0)
MCH: 30.7 pg (ref 26.0–34.0)
MCHC: 33.5 g/dL (ref 32.0–36.0)
Platelet: 240 10*3/uL (ref 150–440)
RBC: 3.78 10*6/uL — ABNORMAL LOW (ref 3.80–5.20)
WBC: 5.9 10*3/uL (ref 3.6–11.0)

## 2011-09-06 LAB — CULTURE, BLOOD (SINGLE)

## 2011-10-15 ENCOUNTER — Emergency Department: Payer: Self-pay | Admitting: *Deleted

## 2011-10-21 ENCOUNTER — Other Ambulatory Visit: Payer: Self-pay | Admitting: *Deleted

## 2011-10-23 ENCOUNTER — Emergency Department: Payer: Self-pay | Admitting: *Deleted

## 2011-10-25 ENCOUNTER — Encounter (HOSPITAL_COMMUNITY): Payer: Self-pay | Admitting: Pharmacy Technician

## 2011-10-25 ENCOUNTER — Encounter: Payer: Self-pay | Admitting: Thoracic Diseases

## 2011-10-26 ENCOUNTER — Other Ambulatory Visit: Payer: Self-pay

## 2011-10-26 ENCOUNTER — Ambulatory Visit: Payer: Self-pay

## 2011-10-26 ENCOUNTER — Ambulatory Visit (HOSPITAL_COMMUNITY)
Admission: RE | Admit: 2011-10-26 | Discharge: 2011-10-26 | Disposition: A | Discharge status: Home / Self Care | Payer: Medicare Other | Source: Ambulatory Visit | Attending: Vascular Surgery | Admitting: Vascular Surgery

## 2011-10-26 ENCOUNTER — Encounter (HOSPITAL_COMMUNITY)
Admission: RE | Disposition: A | Discharge status: Home / Self Care | Payer: Self-pay | Source: Ambulatory Visit | Attending: Vascular Surgery

## 2011-10-26 DIAGNOSIS — N186 End stage renal disease: Secondary | ICD-10-CM | POA: Insufficient documentation

## 2011-10-26 DIAGNOSIS — E669 Obesity, unspecified: Secondary | ICD-10-CM | POA: Insufficient documentation

## 2011-10-26 DIAGNOSIS — T82898A Other specified complication of vascular prosthetic devices, implants and grafts, initial encounter: Secondary | ICD-10-CM | POA: Insufficient documentation

## 2011-10-26 DIAGNOSIS — I871 Compression of vein: Secondary | ICD-10-CM | POA: Insufficient documentation

## 2011-10-26 DIAGNOSIS — Y832 Surgical operation with anastomosis, bypass or graft as the cause of abnormal reaction of the patient, or of later complication, without mention of misadventure at the time of the procedure: Secondary | ICD-10-CM | POA: Insufficient documentation

## 2011-10-26 HISTORY — PX: FISTULOGRAM: SHX5832

## 2011-10-26 LAB — POCT I-STAT, CHEM 8
Calcium, Ion: 1.11 mmol/L — ABNORMAL LOW (ref 1.12–1.32)
HCT: 33 % — ABNORMAL LOW (ref 36.0–46.0)
TCO2: 25 mmol/L (ref 0–100)

## 2011-10-26 SURGERY — FISTULOGRAM
Anesthesia: LOCAL

## 2011-10-26 MED ORDER — SODIUM CHLORIDE 0.9 % IJ SOLN: 3.0000 mL | INTRAMUSCULAR | Status: DC | PRN

## 2011-10-26 MED ORDER — HEPARIN (PORCINE) IN NACL 2-0.9 UNIT/ML-% IJ SOLN
INTRAMUSCULAR | Status: AC
  Filled 2011-10-26: qty 1000

## 2011-10-26 MED ORDER — LIDOCAINE HCL (PF) 1 % IJ SOLN
INTRAMUSCULAR | Status: AC
  Filled 2011-10-26: qty 30

## 2011-10-26 MED ORDER — FENTANYL CITRATE 0.05 MG/ML IJ SOLN
INTRAMUSCULAR | Status: AC
  Filled 2011-10-26: qty 2

## 2011-10-26 NOTE — Op Note (Signed)
OPERATIVE REPORT  DATE OF SURGERY: 10/26/2011  PATIENT: Anna [name], 56 y.o. female MRN: [id number]  DOB: 03-May-1956  PRE-OPERATIVE DIAGNOSIS: End-stage renal disease with poorly functioning right upper arm AV fistula  POST-OPERATIVE DIAGNOSIS:  Same  PROCEDURE: Right arm fistulogram, balloon angioplasty of stenosis in the fistula  SURGEON:  Curt Jews, M.D.  PHYSICIAN ASSISTANT: None  ANESTHESIA:  1% lidocaine with sedation  EBL: Minimal ml     BLOOD ADMINISTERED: None  DRAINS: None  SPECIMEN: None  COUNTS CORRECT:  YES  PLAN OF CARE: Holding area   PATIENT DISPOSITION:  PACU - hemodynamically stable  PROCEDURE DETAILS: The patient was taken to peripheral vascular cath lab and placed in a supine position. The fistula was accessed after imaging it with ultrasound. This was accessed in the lower third of the arm. Puncture set was used and a diagnostic study revealed a high-grade stenosis in the distal upper arm. This was below the level of the access and therefore a second puncture was used using local anesthesia close to the antecubital space. A 6 French sheath was exchanged for the micropuncture introduced the catheter. A 8 mm x 2 cm balloon was chosen for the high-grade stenosis in the vein. Was dilated at the level of the narrowing. The venogram showed good angioplasty result. The vein fistula was imaged further proximally into the subclavian there was no other evidence of stenosis. Ultrasound revealed the vein running somewhat deep. The patient tolerated complication and was transferred to the holding area where the sheaths were pulled   Curt Jews, M.D. 10/26/2011 1:39 PM

## 2011-10-26 NOTE — H&P (Signed)
  The patient is a 56 year old female with access via right upper arm AV fistula. She is having increased difficulty with bleeding post dialysis and is here today for venogram of her upper arm fistula  No past medical history on file.  History  Substance Use Topics  . Smoking status: Not on file  . Smokeless tobacco: Not on file  . Alcohol Use: Not on file    No family history on file.  No Known Allergies  Current facility-administered medications:heparin 2-0.9 UNIT/ML-% infusion, , , , ;  lidocaine (XYLOCAINE) 1 % injection, , , , ;  sodium chloride 0.9 % injection 3 mL, 3 mL, Intravenous, PRN, Rosetta Posner, MD  BP 131/87  Pulse 101  Temp(Src) 97 F (36.1 C) (Oral)  Resp 18  Ht 5\' 1"  (1.549 m)  Wt 225 lb (102.059 kg)  BMI 42.51 kg/m2  SpO2 100%  Body mass index is 42.51 kg/(m^2).       Disc exam well-developed moderately obese black female no acute distress. She does have a thrill in her upper arm fistula. She had a deep running cephalic vein from the mid upper arm proximally  Impression and plan, poorly functioning right upper arm AV fistula for venogram today

## 2011-12-05 ENCOUNTER — Emergency Department: Payer: Self-pay | Admitting: Emergency Medicine

## 2012-01-26 ENCOUNTER — Other Ambulatory Visit (HOSPITAL_COMMUNITY): Payer: Self-pay | Admitting: Nephrology

## 2012-01-26 ENCOUNTER — Encounter (HOSPITAL_COMMUNITY): Payer: Self-pay | Admitting: Pharmacy Technician

## 2012-01-26 DIAGNOSIS — N186 End stage renal disease: Secondary | ICD-10-CM

## 2012-01-27 ENCOUNTER — Encounter (HOSPITAL_COMMUNITY): Payer: Self-pay

## 2012-01-27 ENCOUNTER — Ambulatory Visit (HOSPITAL_COMMUNITY)
Admission: RE | Admit: 2012-01-27 | Discharge: 2012-01-27 | Disposition: A | Discharge status: Home / Self Care | Payer: Medicare Other | Source: Ambulatory Visit | Attending: Nephrology | Admitting: Nephrology

## 2012-01-27 ENCOUNTER — Other Ambulatory Visit (HOSPITAL_COMMUNITY): Payer: Self-pay | Admitting: Nephrology

## 2012-01-27 DIAGNOSIS — N186 End stage renal disease: Secondary | ICD-10-CM

## 2012-01-27 DIAGNOSIS — B1920 Unspecified viral hepatitis C without hepatic coma: Secondary | ICD-10-CM | POA: Insufficient documentation

## 2012-01-27 DIAGNOSIS — I82619 Acute embolism and thrombosis of superficial veins of unspecified upper extremity: Secondary | ICD-10-CM | POA: Insufficient documentation

## 2012-01-27 DIAGNOSIS — I120 Hypertensive chronic kidney disease with stage 5 chronic kidney disease or end stage renal disease: Secondary | ICD-10-CM | POA: Insufficient documentation

## 2012-01-27 DIAGNOSIS — Y832 Surgical operation with anastomosis, bypass or graft as the cause of abnormal reaction of the patient, or of later complication, without mention of misadventure at the time of the procedure: Secondary | ICD-10-CM | POA: Insufficient documentation

## 2012-01-27 DIAGNOSIS — T82898A Other specified complication of vascular prosthetic devices, implants and grafts, initial encounter: Secondary | ICD-10-CM | POA: Insufficient documentation

## 2012-01-27 DIAGNOSIS — I428 Other cardiomyopathies: Secondary | ICD-10-CM | POA: Insufficient documentation

## 2012-01-27 DIAGNOSIS — N2581 Secondary hyperparathyroidism of renal origin: Secondary | ICD-10-CM | POA: Insufficient documentation

## 2012-01-27 DIAGNOSIS — E119 Type 2 diabetes mellitus without complications: Secondary | ICD-10-CM | POA: Insufficient documentation

## 2012-01-27 HISTORY — DX: Obesity, unspecified: E66.9

## 2012-01-27 HISTORY — DX: Type 2 diabetes mellitus without complications: E11.9

## 2012-01-27 HISTORY — DX: Morbid (severe) obesity due to excess calories: E66.01

## 2012-01-27 HISTORY — DX: Anemia, unspecified: D64.9

## 2012-01-27 HISTORY — DX: Cardiomyopathy, unspecified: I42.9

## 2012-01-27 HISTORY — DX: Essential (primary) hypertension: I10

## 2012-01-27 HISTORY — DX: End stage renal disease: N18.6

## 2012-01-27 HISTORY — DX: Unspecified viral hepatitis C without hepatic coma: B19.20

## 2012-01-27 HISTORY — DX: Secondary hyperparathyroidism of renal origin: N25.81

## 2012-01-27 HISTORY — DX: Type 2 diabetes mellitus with other specified complication: E11.69

## 2012-01-27 LAB — POCT I-STAT 4, (NA,K, GLUC, HGB,HCT)
HCT: 30 % — ABNORMAL LOW (ref 36.0–46.0)
Hemoglobin: 10.2 g/dL — ABNORMAL LOW (ref 12.0–15.0)

## 2012-01-27 MED ORDER — HEPARIN SODIUM (PORCINE) 1000 UNIT/ML IJ SOLN
INTRAMUSCULAR | Status: AC
  Administered 2012-01-27: 3000 [IU]
  Filled 2012-01-27: qty 1

## 2012-01-27 MED ORDER — FENTANYL CITRATE 0.05 MG/ML IJ SOLN
INTRAMUSCULAR | Status: AC
  Filled 2012-01-27: qty 4

## 2012-01-27 MED ORDER — HEPARIN SODIUM (PORCINE) 1000 UNIT/ML IJ SOLN
INTRAMUSCULAR | Status: AC | PRN
  Administered 2012-01-27: 3000 [IU] via INTRAVENOUS

## 2012-01-27 MED ORDER — IOHEXOL 300 MG/ML  SOLN
100.0000 mL | Freq: Once | INTRAMUSCULAR | Status: AC | PRN
  Administered 2012-01-27: 50 mL via INTRAVENOUS

## 2012-01-27 MED ORDER — MIDAZOLAM HCL 5 MG/5ML IJ SOLN
INTRAMUSCULAR | Status: AC | PRN
  Administered 2012-01-27 (×4): 1 mg via INTRAVENOUS

## 2012-01-27 MED ORDER — FENTANYL CITRATE 0.05 MG/ML IJ SOLN
INTRAMUSCULAR | Status: AC | PRN
  Administered 2012-01-27 (×4): 50 ug via INTRAVENOUS

## 2012-01-27 MED ORDER — MIDAZOLAM HCL 2 MG/2ML IJ SOLN
INTRAMUSCULAR | Status: AC
  Filled 2012-01-27: qty 4

## 2012-01-27 MED ORDER — ALTEPLASE 100 MG IV SOLR
2.0000 mg | Freq: Once | INTRAVENOUS | Status: AC
  Administered 2012-01-27: 2 mg
  Filled 2012-01-27: qty 2

## 2012-01-27 NOTE — Procedures (Signed)
Technically successful declot of right upper arm native AVF.  No immediate post procedural complications.

## 2012-01-27 NOTE — Discharge Instructions (Signed)
Moderate Sedation, Adult Moderate sedation is given to help you relax or even sleep through a procedure. You may remain sleepy, be clumsy, or have poor balance for several hours following this procedure. Arrange for a responsible adult, family member, or friend to take you home. A responsible adult should stay with you for at least 24 hours or until the medicines have worn off.  Do not participate in any activities where you could become injured for the next 24 hours, or until you feel normal again. Do not:   Drive.   Swim.   Ride a bicycle.   Operate heavy machinery.   Cook.   Use power tools.   Climb ladders.   Work at heights.   Do not make important decisions or sign legal documents until you are improved.   Vomiting may occur if you eat too soon. When you can drink without vomiting, try water, juice, or soup. Try solid foods if you feel little or no nausea.   Only take over-the-counter or prescription medications for pain, discomfort, or fever as directed by your caregiver.If pain medications have been prescribed for you, ask your caregiver how soon it is safe to take them.   Make sure you and your family fully understands everything about the medication given to you. Make sure you understand what side effects may occur.   You should not drink alcohol, take sleeping pills, or medications that cause drowsiness for at least 24 hours.   If you smoke, do not smoke alone.   If you are feeling better, you may resume normal activities 24 hours after receiving sedation.   Keep all appointments as scheduled. Follow all instructions.   Ask questions if you do not understand.  SEEK MEDICAL CARE IF:   Your skin is pale or bluish in color.   You continue to feel sick to your stomach (nauseous) or throw up (vomit).   Your pain is getting worse and not helped by medication.   You have bleeding or swelling.   You are still sleepy or feeling clumsy after 24 hours.  SEEK IMMEDIATE  MEDICAL CARE IF:   You develop a rash.   You have difficulty breathing.   You develop any type of allergic problem.   You have a fever.  Document Released: 04/26/2001 Document Revised: 07/21/2011 Document Reviewed: 09/17/2007 [employer] Patient Information 2012 [employer]. 

## 2012-01-27 NOTE — H&P (Signed)
Anna [name] is an 56 y.o. female.   Chief Complaint: ESRD, now with clotted right upper arm AVF HPI: 56 y/o F with ESRD who presents with clotted right upper arm AVF.  This is a long standing well functioning upper arm fistula with limited number of interventions performed (Declot - 10/02/2008; Fistulagram with intervention - 02/24/2010 and 10/2011 - most recent intervention performed by Vascular surgery).  Patient is otherwise without complaint.  No fever/chills, no SOB, no CP.  Last received dialysis on Monday (6/10).  Past Medical History  Diagnosis Date  . Malignant hypertension   . Hyperparathyroidism, secondary   . Diabetes mellitus type 2 in obese   . ESRD (end stage renal disease)   . Hepatitis C   . Anemia   . Cardiomyopathy   . Morbid obesity     No past surgical history on file.  No family history on file. Social History:  does not have a smoking history on file. She does not have any smokeless tobacco history on file. Her alcohol and drug histories not on file.  Allergies: No Known Allergies   (Not in a hospital admission)  No results found for this or any previous visit (from the past 48 hour(s)). No results found.  Review of Systems  Constitutional: Negative.   Respiratory: Negative.   Cardiovascular: Negative.     Blood pressure 145/88, pulse 72, temperature 98 F (36.7 C), temperature source Oral, resp. rate 13, SpO2 99.00%. Physical Exam  Constitutional: She appears well-developed and well-nourished.  Cardiovascular: Normal heart sounds.   Respiratory: Effort normal and breath sounds normal.  Musculoskeletal: She exhibits edema.       Minimal amount of tenderness with palpation of the thrombosed segment of the fistula.  No erythema or warmth.     Assessment/Plan 56 y/o F with ESRD with clotted right upper arm AVF.  Will attempt percutaneous declot with angioplasty and potential stent placement.  Benefits and risks (including but not limited to contrast  RXN, vessel injury and failure requiring placement of HD catheter) were discussed at length with the pt who is in agreement with POC.    Sandi Mariscal 01/27/2012, 8:32 AM

## 2012-01-27 NOTE — ED Notes (Signed)
Unable to scan medications due to broken scanner.

## 2012-01-30 ENCOUNTER — Telehealth (HOSPITAL_COMMUNITY): Payer: Self-pay | Admitting: *Deleted

## 2012-01-30 NOTE — Telephone Encounter (Signed)
Doing well, no problems or concerns post declot.

## 2012-06-16 ENCOUNTER — Other Ambulatory Visit: Payer: Self-pay | Admitting: Nephrology

## 2012-06-16 LAB — POTASSIUM: Potassium: 5.1 mmol/L

## 2012-10-01 ENCOUNTER — Other Ambulatory Visit: Payer: Self-pay

## 2012-10-08 ENCOUNTER — Ambulatory Visit (HOSPITAL_COMMUNITY): Admission: RE | Admit: 2012-10-08 | Payer: Medicare Other | Source: Ambulatory Visit | Admitting: Vascular Surgery

## 2012-10-08 ENCOUNTER — Encounter (HOSPITAL_COMMUNITY): Admission: RE | Payer: Self-pay | Source: Ambulatory Visit

## 2012-10-08 SURGERY — ASSESSMENT, SHUNT FUNCTION, WITH CONTRAST RADIOGRAPHIC STUDY
Anesthesia: LOCAL | Laterality: Right

## 2012-10-08 NOTE — H&P (Signed)
Patient failed to show for her procedure.

## 2013-10-30 DIAGNOSIS — D689 Coagulation defect, unspecified: Secondary | ICD-10-CM | POA: Insufficient documentation

## 2013-11-11 DIAGNOSIS — L299 Pruritus, unspecified: Secondary | ICD-10-CM | POA: Insufficient documentation

## 2013-11-11 DIAGNOSIS — R519 Headache, unspecified: Secondary | ICD-10-CM | POA: Insufficient documentation

## 2013-11-11 DIAGNOSIS — E162 Hypoglycemia, unspecified: Secondary | ICD-10-CM | POA: Insufficient documentation

## 2013-11-11 DIAGNOSIS — R197 Diarrhea, unspecified: Secondary | ICD-10-CM | POA: Insufficient documentation

## 2014-02-21 ENCOUNTER — Other Ambulatory Visit: Payer: Self-pay | Admitting: Orthopedic Surgery

## 2014-03-05 ENCOUNTER — Encounter (HOSPITAL_BASED_OUTPATIENT_CLINIC_OR_DEPARTMENT_OTHER): Payer: Self-pay | Admitting: *Deleted

## 2014-03-06 ENCOUNTER — Encounter (HOSPITAL_BASED_OUTPATIENT_CLINIC_OR_DEPARTMENT_OTHER): Payer: Self-pay | Admitting: *Deleted

## 2014-03-06 NOTE — Progress Notes (Signed)
03/06/14 1104  [place]  Have you ever been diagnosed with sleep apnea through a sleep study? No  Do you snore loudly (loud enough to be heard through closed doors)?  0  Do you often feel tired, fatigued, or sleepy during the daytime? 1  Has anyone observed you stop breathing during your sleep? 0  Do you have, or are you being treated for high blood pressure? 0  BMI more than 35 kg/m2? 1  Age over 59 years old? 1  Neck circumference greater than 40 cm/16 inches? 1  Gender: 0  Obstructive Sleep Apnea Score 4  Score 4 or greater  Results sent to PCP

## 2014-03-06 NOTE — Progress Notes (Signed)
Pt at [facility]-will need istat and ekg-last ekg 2013 in epic

## 2014-03-07 ENCOUNTER — Encounter (HOSPITAL_COMMUNITY): Payer: Self-pay | Admitting: Pharmacy Technician

## 2014-03-07 NOTE — Progress Notes (Signed)
Spoke with Dr Orene Desanctis and dr Richardo Priest,  Pt needs to be moved to main or.   Call and spoke with Jeani Hawking at office will move to main and call pt to inform of change

## 2014-03-09 MED ORDER — CEFAZOLIN SODIUM-DEXTROSE 2-3 GM-% IV SOLR
2.0000 g | INTRAVENOUS | Status: AC
  Administered 2014-03-10: 2 g via INTRAVENOUS
  Filled 2014-03-09: qty 50

## 2014-03-10 ENCOUNTER — Encounter (HOSPITAL_COMMUNITY): Payer: Self-pay | Admitting: *Deleted

## 2014-03-10 ENCOUNTER — Encounter (HOSPITAL_COMMUNITY): Payer: Medicare Other | Admitting: Certified Registered"

## 2014-03-10 ENCOUNTER — Ambulatory Visit (HOSPITAL_BASED_OUTPATIENT_CLINIC_OR_DEPARTMENT_OTHER)
Admission: RE | Admit: 2014-03-10 | Discharge: 2014-03-10 | Disposition: A | Discharge status: Home / Self Care | Payer: Medicare Other | Source: Ambulatory Visit | Attending: Orthopedic Surgery | Admitting: Orthopedic Surgery

## 2014-03-10 ENCOUNTER — Ambulatory Visit (HOSPITAL_COMMUNITY): Payer: Medicare Other | Admitting: Certified Registered"

## 2014-03-10 ENCOUNTER — Encounter (HOSPITAL_COMMUNITY)
Admission: RE | Disposition: A | Discharge status: Home / Self Care | Payer: Self-pay | Source: Ambulatory Visit | Attending: Orthopedic Surgery

## 2014-03-10 ENCOUNTER — Ambulatory Visit (HOSPITAL_COMMUNITY): Payer: Medicare Other

## 2014-03-10 DIAGNOSIS — Z91040 Latex allergy status: Secondary | ICD-10-CM | POA: Diagnosis not present

## 2014-03-10 DIAGNOSIS — D649 Anemia, unspecified: Secondary | ICD-10-CM | POA: Insufficient documentation

## 2014-03-10 DIAGNOSIS — I120 Hypertensive chronic kidney disease with stage 5 chronic kidney disease or end stage renal disease: Secondary | ICD-10-CM | POA: Diagnosis not present

## 2014-03-10 DIAGNOSIS — Z6841 Body Mass Index (BMI) 40.0 and over, adult: Secondary | ICD-10-CM | POA: Diagnosis not present

## 2014-03-10 DIAGNOSIS — E119 Type 2 diabetes mellitus without complications: Secondary | ICD-10-CM | POA: Diagnosis not present

## 2014-03-10 DIAGNOSIS — N186 End stage renal disease: Secondary | ICD-10-CM | POA: Insufficient documentation

## 2014-03-10 DIAGNOSIS — Z79899 Other long term (current) drug therapy: Secondary | ICD-10-CM | POA: Insufficient documentation

## 2014-03-10 DIAGNOSIS — B182 Chronic viral hepatitis C: Secondary | ICD-10-CM | POA: Diagnosis not present

## 2014-03-10 DIAGNOSIS — G5600 Carpal tunnel syndrome, unspecified upper limb: Secondary | ICD-10-CM | POA: Diagnosis present

## 2014-03-10 HISTORY — DX: Complete loss of teeth, unspecified cause, unspecified class: Z97.2

## 2014-03-10 HISTORY — DX: Complete loss of teeth, unspecified cause, unspecified class: K08.109

## 2014-03-10 HISTORY — DX: Arteriovenous fistula, acquired: I77.0

## 2014-03-10 HISTORY — PX: CARPAL TUNNEL RELEASE: SHX101

## 2014-03-10 LAB — GLUCOSE, CAPILLARY: Glucose-Capillary: 134 mg/dL — ABNORMAL HIGH (ref 70–99)

## 2014-03-10 LAB — POCT I-STAT 4, (NA,K, GLUC, HGB,HCT)
GLUCOSE: 164 mg/dL — AB (ref 70–99)
HEMATOCRIT: 38 % (ref 36.0–46.0)
HEMOGLOBIN: 12.9 g/dL (ref 12.0–15.0)
Potassium: 3.9 mEq/L (ref 3.7–5.3)
Sodium: 138 mEq/L (ref 137–147)

## 2014-03-10 SURGERY — CARPAL TUNNEL RELEASE
Anesthesia: Choice | Site: Wrist | Laterality: Right

## 2014-03-10 SURGERY — CARPAL TUNNEL RELEASE
Anesthesia: General | Site: Hand | Laterality: Right

## 2014-03-10 MED ORDER — PROPOFOL 10 MG/ML IV BOLUS
INTRAVENOUS | Status: AC
  Filled 2014-03-10: qty 20

## 2014-03-10 MED ORDER — NEOSTIGMINE METHYLSULFATE 10 MG/10ML IV SOLN
INTRAVENOUS | Status: AC
  Filled 2014-03-10: qty 1

## 2014-03-10 MED ORDER — BUPIVACAINE HCL (PF) 0.25 % IJ SOLN
INTRAMUSCULAR | Status: DC | PRN
  Administered 2014-03-10: 10 mL

## 2014-03-10 MED ORDER — LIDOCAINE HCL (CARDIAC) 20 MG/ML IV SOLN
INTRAVENOUS | Status: DC | PRN
  Administered 2014-03-10: 40 mg via INTRAVENOUS

## 2014-03-10 MED ORDER — BUPIVACAINE HCL (PF) 0.25 % IJ SOLN
INTRAMUSCULAR | Status: AC
  Filled 2014-03-10: qty 30

## 2014-03-10 MED ORDER — FENTANYL CITRATE 0.05 MG/ML IJ SOLN
INTRAMUSCULAR | Status: DC | PRN
  Administered 2014-03-10 (×3): 50 ug via INTRAVENOUS

## 2014-03-10 MED ORDER — ONDANSETRON HCL 4 MG/2ML IJ SOLN
INTRAMUSCULAR | Status: AC
  Filled 2014-03-10: qty 2

## 2014-03-10 MED ORDER — SODIUM CHLORIDE 0.9 % IV SOLN
INTRAVENOUS | Status: DC | PRN
  Administered 2014-03-10: 09:00:00 via INTRAVENOUS

## 2014-03-10 MED ORDER — FENTANYL CITRATE 0.05 MG/ML IJ SOLN
INTRAMUSCULAR | Status: AC
  Filled 2014-03-10: qty 5

## 2014-03-10 MED ORDER — HYDROMORPHONE HCL PF 1 MG/ML IJ SOLN: 0.2500 mg | INTRAMUSCULAR | Status: DC | PRN

## 2014-03-10 MED ORDER — MIDAZOLAM HCL 2 MG/2ML IJ SOLN
INTRAMUSCULAR | Status: AC
  Filled 2014-03-10: qty 2

## 2014-03-10 MED ORDER — ONDANSETRON HCL 4 MG/2ML IJ SOLN
INTRAMUSCULAR | Status: DC | PRN
  Administered 2014-03-10: 4 mg via INTRAVENOUS

## 2014-03-10 MED ORDER — PROPOFOL 10 MG/ML IV BOLUS
INTRAVENOUS | Status: DC | PRN
  Administered 2014-03-10: 180 mg via INTRAVENOUS

## 2014-03-10 MED ORDER — OXYCODONE HCL 5 MG PO TABS: 5.0000 mg | ORAL_TABLET | Freq: Once | ORAL | Status: DC | PRN

## 2014-03-10 MED ORDER — OXYCODONE HCL 5 MG/5ML PO SOLN: 5.0000 mg | Freq: Once | ORAL | Status: DC | PRN

## 2014-03-10 MED ORDER — GLYCOPYRROLATE 0.2 MG/ML IJ SOLN
INTRAMUSCULAR | Status: AC
  Filled 2014-03-10: qty 3

## 2014-03-10 MED ORDER — MIDAZOLAM HCL 5 MG/5ML IJ SOLN
INTRAMUSCULAR | Status: DC | PRN
  Administered 2014-03-10 (×2): 1 mg via INTRAVENOUS

## 2014-03-10 MED ORDER — CHLORHEXIDINE GLUCONATE 4 % EX LIQD
60.0000 mL | Freq: Once | CUTANEOUS | Status: DC
  Filled 2014-03-10: qty 60

## 2014-03-10 MED ORDER — EPHEDRINE SULFATE 50 MG/ML IJ SOLN
INTRAMUSCULAR | Status: DC | PRN
  Administered 2014-03-10 (×2): 5 mg via INTRAVENOUS

## 2014-03-10 MED ORDER — 0.9 % SODIUM CHLORIDE (POUR BTL) OPTIME
TOPICAL | Status: DC | PRN
  Administered 2014-03-10: 1000 mL

## 2014-03-10 MED ORDER — METOCLOPRAMIDE HCL 5 MG/ML IJ SOLN: 10.0000 mg | Freq: Once | INTRAMUSCULAR | Status: DC | PRN

## 2014-03-10 MED ORDER — ROCURONIUM BROMIDE 50 MG/5ML IV SOLN
INTRAVENOUS | Status: AC
  Filled 2014-03-10: qty 1

## 2014-03-10 MED ORDER — SODIUM CHLORIDE 0.9 % IV SOLN
INTRAVENOUS | Status: DC
  Administered 2014-03-10: 35 mL/h via INTRAVENOUS

## 2014-03-10 MED ORDER — LIDOCAINE HCL (CARDIAC) 20 MG/ML IV SOLN
INTRAVENOUS | Status: AC
  Filled 2014-03-10: qty 5

## 2014-03-10 MED ORDER — METOCLOPRAMIDE HCL 5 MG/ML IJ SOLN
INTRAMUSCULAR | Status: DC | PRN
  Administered 2014-03-10: 10 mg via INTRAVENOUS

## 2014-03-10 MED ORDER — HYDROCODONE-ACETAMINOPHEN 5-325 MG PO TABS: ORAL_TABLET | ORAL | Status: DC

## 2014-03-10 SURGICAL SUPPLY — 44 items
BANDAGE ELASTIC 3 VELCRO ST LF (GAUZE/BANDAGES/DRESSINGS) ×3 IMPLANT
BANDAGE ELASTIC 4 VELCRO ST LF (GAUZE/BANDAGES/DRESSINGS) ×3 IMPLANT
BANDAGE GAUZE ELAST BULKY 4 IN (GAUZE/BANDAGES/DRESSINGS) ×3 IMPLANT
BNDG CMPR 9X4 STRL LF SNTH (GAUZE/BANDAGES/DRESSINGS) ×1
BNDG ESMARK 4X9 LF (GAUZE/BANDAGES/DRESSINGS) ×3 IMPLANT
CORDS BIPOLAR (ELECTRODE) ×3 IMPLANT
COVER SURGICAL LIGHT HANDLE (MISCELLANEOUS) ×3 IMPLANT
CUFF TOURNIQUET SINGLE 18IN (TOURNIQUET CUFF) ×3 IMPLANT
CUFF TOURNIQUET SINGLE 24IN (TOURNIQUET CUFF) IMPLANT
DRAPE SURG 17X23 STRL (DRAPES) ×3 IMPLANT
DRSG PAD ABDOMINAL 8X10 ST (GAUZE/BANDAGES/DRESSINGS) ×6 IMPLANT
DURAPREP 26ML APPLICATOR (WOUND CARE) ×3 IMPLANT
GAUZE XEROFORM 1X8 LF (GAUZE/BANDAGES/DRESSINGS) ×3 IMPLANT
GLOVE BIO SURGEON STRL SZ7.5 (GLOVE) ×6 IMPLANT
GLOVE BIOGEL PI IND STRL 8 (GLOVE) ×1 IMPLANT
GLOVE BIOGEL PI INDICATOR 8 (GLOVE) ×2
GOWN BRE IMP PREV XXLGXLNG (GOWN DISPOSABLE) ×3 IMPLANT
GOWN STRL REUS W/ TWL LRG LVL3 (GOWN DISPOSABLE) ×1 IMPLANT
GOWN STRL REUS W/TWL LRG LVL3 (GOWN DISPOSABLE) ×3
KIT BASIN OR (CUSTOM PROCEDURE TRAY) ×3 IMPLANT
KIT ROOM TURNOVER OR (KITS) ×3 IMPLANT
NDL HYPO 25GX1X1/2 BEV (NEEDLE) IMPLANT
NEEDLE HYPO 25GX1X1/2 BEV (NEEDLE) IMPLANT
NS IRRIG 1000ML POUR BTL (IV SOLUTION) ×3 IMPLANT
PACK ORTHO EXTREMITY (CUSTOM PROCEDURE TRAY) ×3 IMPLANT
PAD ARMBOARD 7.5X6 YLW CONV (MISCELLANEOUS) ×6 IMPLANT
PAD CAST 4YDX4 CTTN HI CHSV (CAST SUPPLIES) IMPLANT
PADDING CAST ABS 4INX4YD NS (CAST SUPPLIES)
PADDING CAST ABS COTTON 4X4 ST (CAST SUPPLIES) ×1 IMPLANT
PADDING CAST COTTON 4X4 STRL (CAST SUPPLIES) ×3
SPONGE GAUZE 4X4 12PLY (GAUZE/BANDAGES/DRESSINGS) ×1 IMPLANT
SPONGE GAUZE 4X4 12PLY STER LF (GAUZE/BANDAGES/DRESSINGS) ×2 IMPLANT
SPONGE LAP 4X18 X RAY DECT (DISPOSABLE) ×3 IMPLANT
SUCTION FRAZIER TIP 10 FR DISP (SUCTIONS) IMPLANT
SUT ETHILON 3 0 PS 1 (SUTURE) ×1 IMPLANT
SUT ETHILON 4 0 PS 2 18 (SUTURE) ×2 IMPLANT
SUT VIC AB 3-0 SH 18 (SUTURE) ×1 IMPLANT
SYR CONTROL 10ML LL (SYRINGE) IMPLANT
TOWEL OR 17X24 6PK STRL BLUE (TOWEL DISPOSABLE) ×3 IMPLANT
TOWEL OR 17X26 10 PK STRL BLUE (TOWEL DISPOSABLE) ×3 IMPLANT
TUBE CONNECTING 12'X1/4 (SUCTIONS) ×1
TUBE CONNECTING 12X1/4 (SUCTIONS) ×1 IMPLANT
UNDERPAD 30X30 INCONTINENT (UNDERPADS AND DIAPERS) ×3 IMPLANT
WATER STERILE IRR 1000ML POUR (IV SOLUTION) ×1 IMPLANT

## 2014-03-10 NOTE — Anesthesia Postprocedure Evaluation (Signed)
Anesthesia Post Note  Patient: Anna [name]  Procedure(s) Performed: Procedure(s) (LRB): CARPAL TUNNEL RELEASE (Right)  Anesthesia type: General  Patient location: PACU  Post pain: Pain level controlled  Post assessment: Patient's Cardiovascular Status Stable  Last Vitals:  Filed Vitals:   03/10/14 1053  BP: 126/69  Pulse: 92  Temp: 36.4 C  Resp: 14    Post vital signs: Reviewed and stable  Level of consciousness: alert  Complications: No apparent anesthesia complications

## 2014-03-10 NOTE — Anesthesia Procedure Notes (Signed)
Procedure Name: LMA Insertion Date/Time: 03/10/2014 10:01 AM Performed by: Octavio Graves Pre-anesthesia Checklist: Patient identified, Timeout performed, Emergency Drugs available, Suction available and Patient being monitored Patient Re-evaluated:Patient Re-evaluated prior to inductionOxygen Delivery Method: Circle system utilized Preoxygenation: Pre-oxygenation with 100% oxygen Intubation Type: IV induction Ventilation: Mask ventilation without difficulty LMA: LMA inserted LMA Size: 4.0 Tube type: Oral Number of attempts: 1 Placement Confirmation: positive ETCO2 and breath sounds checked- equal and bilateral Tube secured with: Tape Dental Injury: Teeth and Oropharynx as per pre-operative assessment  Comments: IV induction fredrick- LMA AM CRNA atraumtic teeth and mouth as preop

## 2014-03-10 NOTE — Discharge Instructions (Addendum)
[facility] Instructions Hand Surgery  Wound Care: Keep your hand elevated above the level of your heart.  Do not allow it to dangle by your side.  Keep the dressing dry and do not remove it unless your doctor advises you to do so.  He will usually change it at the time of your post-op visit.  Moving your fingers is advised to stimulate circulation but will depend on the site of your surgery.  If you have a splint applied, your doctor will advise you regarding movement.  Activity: Do not drive or operate machinery today.  Rest today and then you may return to your normal activity and work as indicated by your physician.  Diet:  Drink liquids today or eat a light diet.  You may resume a regular diet tomorrow.    General expectations: Pain for two to three days. Fingers may become slightly swollen.  Call your doctor if any of the following occur: Severe pain not relieved by pain medication. Elevated temperature. Dressing soaked with blood. Inability to move fingers. White or bluish color to fingers. What to eat:  For your first meals, you should eat lightly; only small meals initially.  If you do not have nausea, you may eat larger meals.  Avoid spicy, greasy and heavy food.    General Anesthesia, Adult, Care After  Refer to this sheet in the next few weeks. These instructions provide you with information on caring for yourself after your procedure. Your health care provider may also give you more specific instructions. Your treatment has been planned according to current medical practices, but problems sometimes occur. Call your health care provider if you have any problems or questions after your procedure.  WHAT TO EXPECT AFTER THE PROCEDURE  After the procedure, it is typical to experience:  Sleepiness.  Nausea and vomiting. HOME CARE INSTRUCTIONS  For the first 24 hours after general anesthesia:  Have a responsible person with you.  Do not drive a car. If you are alone, do not  take public transportation.  Do not drink alcohol.  Do not take medicine that has not been prescribed by your health care provider.  Do not sign important papers or make important decisions.  You may resume a normal diet and activities as directed by your health care provider.  Change bandages (dressings) as directed.  If you have questions or problems that seem related to general anesthesia, call the hospital and ask for the anesthetist or anesthesiologist on call. SEEK MEDICAL CARE IF:  You have nausea and vomiting that continue the day after anesthesia.  You develop a rash. SEEK IMMEDIATE MEDICAL CARE IF:  You have difficulty breathing.  You have chest pain.  You have any allergic problems. Document Released: 11/07/2000 Document Revised: 04/03/2013 Document Reviewed: 02/14/2013  [facility] Patient Information 2014 [place], [place].

## 2014-03-10 NOTE — Op Note (Signed)
[id number] 

## 2014-03-10 NOTE — Brief Op Note (Signed)
03/10/2014  10:46 AM  PATIENT:  Anna [name]  58 y.o. female  PRE-OPERATIVE DIAGNOSIS:  right carpel tunnel syndrome  POST-OPERATIVE DIAGNOSIS:  right carpel tunnel syndrome  PROCEDURE:  Procedure(s): CARPAL TUNNEL RELEASE (Right)  SURGEON:  Surgeon(s) and Role:    * Tennis Must, MD - Primary  PHYSICIAN ASSISTANT:   ASSISTANTS: none   ANESTHESIA:   general  EBL:     BLOOD ADMINISTERED:none  DRAINS: none   LOCAL MEDICATIONS USED:  MARCAINE     SPECIMEN:  No Specimen  DISPOSITION OF SPECIMEN:  N/A  COUNTS:  YES  TOURNIQUET:   Total Tourniquet Time Documented: area (laterality) - 33 minutes Total: area (laterality) - 33 minutes   DICTATION: .Other Dictation: Dictation Number [id number]  PLAN OF CARE: Discharge to home after PACU  PATIENT DISPOSITION:  PACU - hemodynamically stable.

## 2014-03-10 NOTE — Transfer of Care (Signed)
Immediate Anesthesia Transfer of Care Note  Patient: Anna [name]  Procedure(s) Performed: Procedure(s): CARPAL TUNNEL RELEASE (Right)  Patient Location: PACU  Anesthesia Type:General  Level of Consciousness: awake and alert   Airway & Oxygen Therapy: Patient Spontanous Breathing and Patient connected to nasal cannula oxygen  Post-op Assessment: Report given to PACU RN and Post -op Vital signs reviewed and stable  Post vital signs: Reviewed and stable  Complications: No apparent anesthesia complications

## 2014-03-10 NOTE — H&P (Signed)
Anna [name] is an 58 y.o. female.   Chief Complaint: right carpal tunnel syndrome HPI: 58 yo rhd female with pins and needles sensation in right hand.  Nocturnal symptoms nightly that wake her and cause her to shake her hand to get relief.  Positive nerve conduction studies.  She wishes to have right carpal tunnel release to manage symptoms.  Past Medical History  Diagnosis Date  . Malignant hypertension   . Hyperparathyroidism, secondary   . Diabetes mellitus type 2 in obese   . ESRD (end stage renal disease)   . Hepatitis C   . Anemia   . Cardiomyopathy   . Morbid obesity   . Full dentures     lost them-no teeth  . AV fistula     in place-rt upper arm    Past Surgical History  Procedure Laterality Date  . Av fistula placement  2013    right upper arm  . Incisional hernia repair  2007  . Av fistula placement  2008    rt upper arm  . Av fistula placement  2008    left  . Av fistula repair  2008    left taken out  . Cholecystectomy  2003    lap choli    History reviewed. No pertinent family history. Social History:  reports that she has never smoked. She does not have any smokeless tobacco history on file. She reports that she does not drink alcohol or use illicit drugs.  Allergies:  Allergies  Allergen Reactions  . Latex Itching    LATEX GLOVES    Medications Prior to Admission  Medication Sig Dispense Refill  . calcium acetate (PHOSLO) 667 MG capsule Take 2,001 mg by mouth 3 (three) times daily with meals.      . cinacalcet (SENSIPAR) 60 MG tablet Take 60 mg by mouth daily.      Marland Kitchen glipiZIDE (GLUCOTROL XL) 2.5 MG 24 hr tablet Take 2.5 mg by mouth daily.      Marland Kitchen ibuprofen (ADVIL,MOTRIN) 800 MG tablet Take 800 mg by mouth every 8 (eight) hours as needed for mild pain.       Marland Kitchen levothyroxine (SYNTHROID, LEVOTHROID) 25 MCG tablet Take 25 mcg by mouth daily.        Results for orders placed during the hospital encounter of 03/10/14 (from the past 48 hour(s))  POCT  I-STAT 4, (NA,K, GLUC, HGB,HCT)     Status: Abnormal   Collection Time    03/10/14  8:17 AM      Result Value Ref Range   Sodium 138  137 - 147 mEq/L   Potassium 3.9  3.7 - 5.3 mEq/L   Glucose, Bld 164 (*) 70 - 99 mg/dL   HCT 38.0  36.0 - 46.0 %   Hemoglobin 12.9  12.0 - 15.0 g/dL    Dg Chest 2 View  03/10/2014   CLINICAL DATA:  Preoperative evaluation; hypertension  EXAM: CHEST  2 VIEW  COMPARISON:  May 19, 2008  FINDINGS: There is mild scarring in the right mid lung which is stable. There is no edema or consolidation. Heart size and pulmonary vascularity are within normal limits. No adenopathy. There is degenerative change in the thoracic spine.  IMPRESSION: No edema or consolidation.  Mild scarring right mid lung, stable.   Electronically Signed   By: Lowella Grip M.D.   On: 03/10/2014 07:54     A comprehensive review of systems was negative.  Blood pressure 126/71, temperature 97.8 F (  36.6 C), temperature source Oral, resp. rate 74, height 5\' 1"  (1.549 m), weight 102.059 kg (225 lb), SpO2 100.00%.  General appearance: alert, cooperative and appears stated age Head: Normocephalic, without obvious abnormality, atraumatic Neck: supple, symmetrical, trachea midline Resp: clear to auscultation bilaterally Cardio: regular rate and rhythm GI: non tender Extremities: intact sensation and capillary refill all digits.  +epl/fpl/io.  no wounds. Pulses: 2+ and symmetric Skin: Skin color, texture, turgor normal. No rashes or lesions Neurologic: Grossly normal Incision/Wound: none  Assessment/Plan Right carpal tunnel syndrome.  Non operative and operative treatment options were discussed with the patient and patient wishes to proceed with operative treatment. Plan right carpal tunnel release.  Risks, benefits, and alternatives of surgery were discussed and the patient agrees with the plan of care.   Ithiel Liebler R 03/10/2014, 9:41 AM

## 2014-03-10 NOTE — Anesthesia Preprocedure Evaluation (Addendum)
Anesthesia Evaluation    Airway       Dental  (+) Edentulous Upper, Edentulous Lower, Dental Advisory Given   Pulmonary          Cardiovascular hypertension, On Medications     Neuro/Psych    GI/Hepatic (+) Hepatitis -  Endo/Other  diabetes, Oral Hypoglycemic AgentsMorbid obesity  Renal/GU Renal disease     Musculoskeletal   Abdominal   Peds  Hematology  (+) anemia ,   Anesthesia Other Findings   Reproductive/Obstetrics                         Anesthesia Physical Anesthesia Plan  ASA: III  Anesthesia Plan: General   Post-op Pain Management:    Induction: Intravenous  Airway Management Planned: LMA  Additional Equipment:   Intra-op Plan:   Post-operative Plan:   Informed Consent:   Plan Discussed with:   Anesthesia Plan Comments:         Anesthesia Quick Evaluation

## 2014-03-11 ENCOUNTER — Encounter (HOSPITAL_COMMUNITY): Payer: Self-pay | Admitting: Orthopedic Surgery

## 2014-03-11 NOTE — Op Note (Signed)
NAMEUCHECHI, SPLAIN NO.:  [id number]  MEDICAL RECORD NO.:  [id number]  LOCATION:  [facility]                         FACILITY:  [facility]  PHYSICIAN:  Leanora Cover, MD        DATE OF BIRTH:  Oct 03, 1955  DATE OF PROCEDURE:  03/10/2014 DATE OF DISCHARGE:  03/10/2014                              OPERATIVE REPORT   PREOPERATIVE DIAGNOSIS:  Right carpal tunnel syndrome.  POSTOPERATIVE DIAGNOSIS:  Right carpal tunnel syndrome.  PROCEDURE:  Right carpal tunnel release.  SURGEON:  Leanora Cover, MD  ASSISTANTS:  None.  ANESTHESIA:  General.  IV FLUIDS:  Per anesthesia flow sheet.  ESTIMATED BLOOD LOSS:  Minimal.  COMPLICATIONS:  None.  SPECIMENS:  None.  TOURNIQUET TIME:  33 minutes.  DISPOSITION:  Stable to PACU.  INDICATIONS:  Ms. Dini is a 58 year old right-hand-dominant female with pins and needle sensation in the right upper extremity.  She has nocturnal symptoms that wake her at night and cause her to shake her hand to gain relief.  She has positive nerve conduction studies.  She wished to have a right carpal tunnel release for management of symptoms. Risks, benefits, and alternatives of surgery were discussed including risk of blood loss, infection, damage to nerves, vessels, tendons, ligaments, bone, failure of surgery, need for additional surgery, complications with wound healing, continued pain, continued carpal tunnel syndrome and damage to the motor branch.  She voiced understanding of these risks and elected to proceed.  OPERATIVE COURSE:  After being identified preoperatively by myself, the patient and I agreed upon procedure and site of procedure.  Surgical site was marked.  The risks, benefits, and alternatives of surgery were reviewed and she wished to proceed.  Surgical consent had been signed. She was given IV Ancef as preoperative antibiotic prophylaxis.  She was transported to the operating room and placed on the operating room table in  supine position with the right upper extremity on arm board.  General anesthesia was induced by Anesthesiology.  Right upper extremity was prepped and draped in normal sterile orthopedic fashion.  A surgical pause was performed between surgeons, anesthesia, and operating room staff, and all were in agreement as to the patient, procedure, and site of procedure.  Tourniquet was placed on the forearm distal to her fistula.  It was inflated to 250 mmHg after exsanguination of the hand and forearm with an Esmarch bandage.  An incision was made over the transverse carpal ligament and carried into subcutaneous tissues by spreading technique.  Bipolar electrocautery was used to obtain hemostasis.  The palmar fascia was sharply incised.  The transverse carpal ligament was identified.  It was sharply incised.  It was incised distally first.  Care was taken to ensure complete decompression distally.  It was then incised proximally.  Scissors were used to split the distal aspect of the volar antebrachial fascia.  A finger was placed into the wound to ensure complete decompression which was the case.  The nerve was inspected.  It was hyperemic and flattened.  The motor branch was identified and was intact.  The wound was copiously irrigated with sterile saline.  It was then closed with 4-0 nylon  in a horizontal mattress fashion.  It was injected with 10 mL of 0.25% plain Marcaine to aid in postoperative analgesia.  The wound was then dressed with sterile Xeroform, 4x4s, and ABD and wrapped with Kerlix and Ace bandage.  Tourniquet was deflated at 33 minutes.  Fingertips were pink with brisk capillary refill after deflation of the tourniquet. Operative drapes were broken down and the patient was awoken from anesthesia safely.  The fistula was palpated after the procedure and had flow.  The patient was transferred back to stretcher and taken to PACU in stable condition.  I will see her back in the office  in 1 week for postoperative followup.  I will give her Norco 5/325, 1-2 p.o. q.6 hours p.r.n. pain, dispensed #30.     Leanora Cover, MD     KK/MEDQ  D:  03/10/2014  T:  03/11/2014  Job:  [id number]

## 2014-05-02 DIAGNOSIS — K739 Chronic hepatitis, unspecified: Secondary | ICD-10-CM | POA: Insufficient documentation

## 2014-07-24 ENCOUNTER — Encounter (HOSPITAL_COMMUNITY): Payer: Self-pay | Admitting: Vascular Surgery

## 2014-12-04 ENCOUNTER — Encounter: Payer: Self-pay | Admitting: Vascular Surgery

## 2014-12-05 ENCOUNTER — Ambulatory Visit: Payer: Medicare Other | Admitting: Vascular Surgery

## 2015-01-15 DIAGNOSIS — E1122 Type 2 diabetes mellitus with diabetic chronic kidney disease: Secondary | ICD-10-CM | POA: Insufficient documentation

## 2015-03-13 DIAGNOSIS — D509 Iron deficiency anemia, unspecified: Secondary | ICD-10-CM | POA: Insufficient documentation

## 2015-12-10 LAB — HM HEPATITIS C SCREENING LAB: HM Hepatitis Screen: NEGATIVE

## 2016-05-22 ENCOUNTER — Emergency Department: Payer: Medicare Other

## 2016-05-22 ENCOUNTER — Emergency Department
Admission: EM | Admit: 2016-05-22 | Discharge: 2016-05-22 | Disposition: A | Discharge status: Home / Self Care | Payer: Medicare Other | Attending: Emergency Medicine | Admitting: Emergency Medicine

## 2016-05-22 ENCOUNTER — Encounter: Payer: Self-pay | Admitting: *Deleted

## 2016-05-22 DIAGNOSIS — N186 End stage renal disease: Secondary | ICD-10-CM | POA: Diagnosis not present

## 2016-05-22 DIAGNOSIS — M19012 Primary osteoarthritis, left shoulder: Secondary | ICD-10-CM | POA: Diagnosis not present

## 2016-05-22 DIAGNOSIS — Z91040 Latex allergy status: Secondary | ICD-10-CM | POA: Diagnosis not present

## 2016-05-22 DIAGNOSIS — Z791 Long term (current) use of non-steroidal anti-inflammatories (NSAID): Secondary | ICD-10-CM | POA: Diagnosis not present

## 2016-05-22 DIAGNOSIS — I120 Hypertensive chronic kidney disease with stage 5 chronic kidney disease or end stage renal disease: Secondary | ICD-10-CM | POA: Diagnosis not present

## 2016-05-22 DIAGNOSIS — Z992 Dependence on renal dialysis: Secondary | ICD-10-CM | POA: Insufficient documentation

## 2016-05-22 DIAGNOSIS — Z7984 Long term (current) use of oral hypoglycemic drugs: Secondary | ICD-10-CM | POA: Insufficient documentation

## 2016-05-22 DIAGNOSIS — M19011 Primary osteoarthritis, right shoulder: Secondary | ICD-10-CM | POA: Diagnosis not present

## 2016-05-22 DIAGNOSIS — E1122 Type 2 diabetes mellitus with diabetic chronic kidney disease: Secondary | ICD-10-CM | POA: Insufficient documentation

## 2016-05-22 DIAGNOSIS — M25511 Pain in right shoulder: Secondary | ICD-10-CM | POA: Diagnosis present

## 2016-05-22 MED ORDER — MELOXICAM 15 MG PO TABS: 15.0000 mg | ORAL_TABLET | Freq: Every day | ORAL | 0 refills | Status: AC

## 2016-05-22 NOTE — ED Notes (Signed)
NAD noted at time of D/C. Pt denies questions or concerns. Pt ambulatory to the lobby at this time.  

## 2016-05-22 NOTE — ED Provider Notes (Signed)
[facility] Emergency Department Provider Note ____________________________________________  Time seen: Approximately 10:04 AM  I have reviewed the triage vital signs and the nursing notes.   HISTORY  Chief Complaint Shoulder Pain    HPI Anna [name] is a 60 y.o. female who presents to the emergency department for evaluation of bilateral shoulder pain. Symptoms have been worsening over the past 3 weeks. No known injury. Right shoulder is more painful than the left. Pain is worse while trying to sleep and in the morning. No relief with ibuprofen. She is a dialysis patient--last dialysis was yesterday.   Past Medical History:  Diagnosis Date  . Anemia   . AV fistula (HCC)    in place-rt upper arm  . Cardiomyopathy ([place])   . Diabetes mellitus type 2 in obese ([place])   . ESRD (end stage renal disease) ([place])   . Full dentures    lost them-no teeth  . Hepatitis C   . Hyperparathyroidism, secondary ([place])   . Malignant hypertension   . Morbid obesity ([place])     There are no active problems to display for this patient.   Past Surgical History:  Procedure Laterality Date  . AV FISTULA PLACEMENT  2013   right upper arm  . AV FISTULA PLACEMENT  2008   rt upper arm  . AV FISTULA PLACEMENT  2008   left  . AV FISTULA REPAIR  2008   left taken out  . CARPAL TUNNEL RELEASE Right 03/10/2014   Procedure: CARPAL TUNNEL RELEASE;  Surgeon: Tennis Must, MD;  Location: [place];  Service: Orthopedics;  Laterality: Right;  . CHOLECYSTECTOMY  2003   lap choli  . FISTULOGRAM N/A 10/26/2011   Procedure: FISTULOGRAM;  Surgeon: Rosetta Posner, MD;  Location: [facility] CATH LAB;  Service: Cardiovascular;  Laterality: N/A;  . INCISIONAL HERNIA REPAIR  2007    Prior to Admission medications   Medication Sig Start Date End Date Taking? Authorizing Provider  calcium acetate (PHOSLO) 667 MG capsule Take 2,001 mg by mouth 3 (three) times daily with meals.    Historical Provider, MD   cinacalcet (SENSIPAR) 60 MG tablet Take 60 mg by mouth daily.    Historical Provider, MD  glipiZIDE (GLUCOTROL XL) 2.5 MG 24 hr tablet Take 2.5 mg by mouth daily.    Historical Provider, MD  HYDROcodone-acetaminophen [facility]) 5-325 MG per tablet 1-2 tabs po q6 hours prn pain 03/10/14   Leanora Cover, MD  ibuprofen (ADVIL,MOTRIN) 800 MG tablet Take 800 mg by mouth every 8 (eight) hours as needed for mild pain.     Historical Provider, MD  levothyroxine (SYNTHROID, LEVOTHROID) 25 MCG tablet Take 25 mcg by mouth daily.    Historical Provider, MD  meloxicam (MOBIC) 15 MG tablet Take 1 tablet (15 mg total) by mouth daily. 05/22/16   Victorino Dike, FNP    Allergies Latex  No family history on file.  Social History Social History  Substance Use Topics  . Smoking status: Never Smoker  . Smokeless tobacco: Not on file  . Alcohol use No    Review of Systems Constitutional: No recent illness. Cardiovascular: Denies chest pain or palpitations. Respiratory: Denies shortness of breath. Musculoskeletal: Pain in bilateral shoulders. Skin: Negative for rash, wound, lesion. Neurological: Negative for focal weakness or numbness.  ____________________________________________   PHYSICAL EXAM:  VITAL SIGNS: ED Triage Vitals  Enc Vitals Group     BP 05/22/16 1000 140/85     Pulse Rate 05/22/16 1000  73     Resp 05/22/16 1000 20     Temp 05/22/16 1000 97.7 F (36.5 C)     Temp Source 05/22/16 1000 Oral     SpO2 05/22/16 1000 100 %     Weight 05/22/16 0954 220 lb (99.8 kg)     Height 05/22/16 0954 5\' 1"  (1.549 m)     Head Circumference --      Peak Flow --      Pain Score 05/22/16 0952 8     Pain Loc --      Pain Edu? --      Excl. in [place]? --     Constitutional: Alert and oriented. Well appearing and in no acute distress. Eyes: Conjunctivae are normal. EOMI. Head: Atraumatic. Neck: No stridor.  Respiratory: Normal respiratory effort.   Musculoskeletal: Limited ROM of bilateral  shoulders due to pain. Right shoulder painful on abduction at about 45* and external rotation. Left shoulder pain at the end of full abduction. Do deformities or step off noted on either shoulder. No focal or muscular tenderness. Neurologic:  Normal speech and language. No gross focal neurologic deficits are appreciated. Speech is normal. No gait instability.  Skin:  Skin is warm, dry and intact. Atraumatic. Psychiatric: Mood and affect are normal. Speech and behavior are normal.  ____________________________________________   LABS (all labs ordered are listed, but only abnormal results are displayed)  Labs Reviewed - No data to display ____________________________________________  RADIOLOGY  Right shoulder: Osteoarthritis of the glenohumeral joint on the right with cystic  changes in the glenoid.    Some osteoarthritis at the [facility] joint is well.    ____________________________________________   PROCEDURES  Procedure(s) performed: None   ____________________________________________   INITIAL IMPRESSION / ASSESSMENT AND PLAN / ED COURSE  Clinical Course    Pertinent labs & imaging results that were available during my care of the patient were reviewed by me and considered in my medical decision making (see chart for details).  Patient was advise to follow up with orthopedics. She was given a prescription for Meloxicam today. She is to return to the ER for symptoms that change or worsen if unable to schedule an appointment. ____________________________________________   FINAL CLINICAL IMPRESSION(S) / ED DIAGNOSES  Final diagnoses:  Osteoarthritis of both shoulders, unspecified osteoarthritis type       Victorino Dike, FNP 05/22/16 1612    Delman Kitten, MD 05/22/16 1620

## 2016-05-22 NOTE — ED Triage Notes (Signed)
Pt complains of bilateral muscular shoulder pain for 3 weeks, pt denies any other symptoms

## 2016-09-21 DIAGNOSIS — E0590 Thyrotoxicosis, unspecified without thyrotoxic crisis or storm: Secondary | ICD-10-CM | POA: Insufficient documentation

## 2016-10-08 ENCOUNTER — Emergency Department
Admission: EM | Admit: 2016-10-08 | Discharge: 2016-10-08 | Disposition: A | Discharge status: Home / Self Care | Payer: Medicare Other | Attending: Emergency Medicine | Admitting: Emergency Medicine

## 2016-10-08 ENCOUNTER — Encounter: Payer: Self-pay | Admitting: Emergency Medicine

## 2016-10-08 DIAGNOSIS — Z79899 Other long term (current) drug therapy: Secondary | ICD-10-CM | POA: Insufficient documentation

## 2016-10-08 DIAGNOSIS — Z992 Dependence on renal dialysis: Secondary | ICD-10-CM | POA: Diagnosis not present

## 2016-10-08 DIAGNOSIS — Z7984 Long term (current) use of oral hypoglycemic drugs: Secondary | ICD-10-CM | POA: Insufficient documentation

## 2016-10-08 DIAGNOSIS — Y733 Surgical instruments, materials and gastroenterology and urology devices (including sutures) associated with adverse incidents: Secondary | ICD-10-CM | POA: Insufficient documentation

## 2016-10-08 DIAGNOSIS — T8249XA Other complication of vascular dialysis catheter, initial encounter: Secondary | ICD-10-CM | POA: Diagnosis not present

## 2016-10-08 DIAGNOSIS — I120 Hypertensive chronic kidney disease with stage 5 chronic kidney disease or end stage renal disease: Secondary | ICD-10-CM | POA: Insufficient documentation

## 2016-10-08 DIAGNOSIS — T82590A Other mechanical complication of surgically created arteriovenous fistula, initial encounter: Secondary | ICD-10-CM

## 2016-10-08 DIAGNOSIS — Z91040 Latex allergy status: Secondary | ICD-10-CM | POA: Insufficient documentation

## 2016-10-08 DIAGNOSIS — E1122 Type 2 diabetes mellitus with diabetic chronic kidney disease: Secondary | ICD-10-CM | POA: Insufficient documentation

## 2016-10-08 DIAGNOSIS — N186 End stage renal disease: Secondary | ICD-10-CM | POA: Diagnosis not present

## 2016-10-08 LAB — BASIC METABOLIC PANEL
ANION GAP: 14 (ref 5–15)
BUN: 45 mg/dL — ABNORMAL HIGH (ref 6–20)
CALCIUM: 7.8 mg/dL — AB (ref 8.9–10.3)
CO2: 25 mmol/L (ref 22–32)
Chloride: 102 mmol/L (ref 101–111)
Creatinine, Ser: 13.76 mg/dL — ABNORMAL HIGH (ref 0.44–1.00)
GFR calc non Af Amer: 3 mL/min — ABNORMAL LOW (ref >60)
GFR, EST AFRICAN AMERICAN: 3 mL/min — AB (ref >60)
Glucose, Bld: 171 mg/dL — ABNORMAL HIGH (ref 65–99)
Potassium: 3.6 mmol/L (ref 3.5–5.1)
SODIUM: 141 mmol/L (ref 135–145)

## 2016-10-08 LAB — CBC
HCT: 35 % (ref 35.0–47.0)
HEMOGLOBIN: 12.1 g/dL (ref 12.0–16.0)
MCH: 32.5 pg (ref 26.0–34.0)
MCHC: 34.5 g/dL (ref 32.0–36.0)
MCV: 94 fL (ref 80.0–100.0)
Platelets: 211 10*3/uL (ref 150–440)
RBC: 3.72 MIL/uL — AB (ref 3.80–5.20)
RDW: 14.7 % — ABNORMAL HIGH (ref 11.5–14.5)
WBC: 5.3 10*3/uL (ref 3.6–11.0)

## 2016-10-08 NOTE — ED Triage Notes (Signed)
Pt to ed with c/o right arm pain after she reports her dialysis catheter "infiltrated" during dialysis today.  She reports similar incident on Tuesday and has not had dialysis since.  Pt right arm with noted swelling to graft area.

## 2016-10-08 NOTE — ED Provider Notes (Signed)
[facility] Emergency Department Provider Note  ____________________________________________   First MD Initiated Contact with Patient 10/08/16 [phone]     (approximate)  I have reviewed the triage vital signs and the nursing notes.   HISTORY  Chief Complaint Arm Pain   HPI Anna [name] is a 61 y.o. female with a history of end-stage renal disease on dialysis was presented to the emergency department today with a problem with her right upper extremity fistula. She says the last full session of dialysis she had was this past Tuesday. It is currently Saturday. She said that this past Thursday she was unable to have any of her dialysis session because it seemed that her fistula had "clotted off." She says that there was appointment made for her by her dialysis center which she was not told about with the vascular surgeon. She said that because she was unable to go to this appointment which she was not told about she then returned to dialysis today where she had about 30 minutes of treatment for her fistula was having issues again. She was experiencing some degree of pain during her dialysis today as well when the fistula stopped working. She denies any symptoms at this time including shortness of breath or chest pain.   Past Medical History:  Diagnosis Date  . Anemia   . AV fistula (HCC)    in place-rt upper arm  . Cardiomyopathy ([place])   . Diabetes mellitus type 2 in obese ([place])   . ESRD (end stage renal disease) ([place])   . Full dentures    lost them-no teeth  . Hepatitis C   . Hyperparathyroidism, secondary ([place])   . Malignant hypertension   . Morbid obesity ([place])     There are no active problems to display for this patient.   Past Surgical History:  Procedure Laterality Date  . AV FISTULA PLACEMENT  2013   right upper arm  . AV FISTULA PLACEMENT  2008   rt upper arm  . AV FISTULA PLACEMENT  2008   left  . AV FISTULA REPAIR  2008   left taken out   . CARPAL TUNNEL RELEASE Right 03/10/2014   Procedure: CARPAL TUNNEL RELEASE;  Surgeon: Tennis Must, MD;  Location: [place];  Service: Orthopedics;  Laterality: Right;  . CHOLECYSTECTOMY  2003   lap choli  . FISTULOGRAM N/A 10/26/2011   Procedure: FISTULOGRAM;  Surgeon: Rosetta Posner, MD;  Location: [facility] CATH LAB;  Service: Cardiovascular;  Laterality: N/A;  . INCISIONAL HERNIA REPAIR  2007    Prior to Admission medications   Medication Sig Start Date End Date Taking? Authorizing Provider  calcium acetate (PHOSLO) 667 MG capsule Take 2,001 mg by mouth 3 (three) times daily with meals.   Yes Historical Provider, MD  cinacalcet (SENSIPAR) 60 MG tablet Take 60 mg by mouth daily.   Yes Historical Provider, MD  glipiZIDE (GLUCOTROL XL) 2.5 MG 24 hr tablet Take 2.5 mg by mouth daily.   Yes Historical Provider, MD  ibuprofen (ADVIL,MOTRIN) 800 MG tablet Take 800 mg by mouth every 8 (eight) hours as needed for mild pain.    Yes Historical Provider, MD  levothyroxine (SYNTHROID, LEVOTHROID) 25 MCG tablet Take 25 mcg by mouth daily.   Yes Historical Provider, MD  HYDROcodone-acetaminophen [facility]) 5-325 MG per tablet 1-2 tabs po q6 hours prn pain Patient not taking: Reported on 10/08/2016 03/10/14   Leanora Cover, MD  meloxicam (MOBIC) 15 MG tablet Take 1  tablet (15 mg total) by mouth daily. Patient not taking: Reported on 10/08/2016 05/22/16   Victorino Dike, FNP    Allergies Latex  History reviewed. No pertinent family history.  Social History Social History  Substance Use Topics  . Smoking status: Never Smoker  . Smokeless tobacco: Never Used  . Alcohol use No    Review of Systems Constitutional: No fever/chills Eyes: No visual changes. ENT: No sore throat. Cardiovascular: Denies chest pain. Respiratory: Denies shortness of breath. Gastrointestinal: No abdominal pain.  No nausea, no vomiting.  No diarrhea.  No constipation. Genitourinary: Negative for dysuria. Musculoskeletal: Negative for  back pain. Skin: Negative for rash. Neurological: Negative for headaches, focal weakness or numbness.  10-point ROS otherwise negative.  ____________________________________________   PHYSICAL EXAM:  VITAL SIGNS: ED Triage Vitals  Enc Vitals Group     BP 10/08/16 0750 (!) 145/91     Pulse Rate 10/08/16 0750 87     Resp 10/08/16 0750 18     Temp 10/08/16 0750 98.4 F (36.9 C)     Temp Source 10/08/16 0750 Oral     SpO2 10/08/16 0750 95 %     Weight 10/08/16 0749 218 lb 11.1 oz (99.2 kg)     Height 10/08/16 0749 5\' 1"  (1.549 m)     Head Circumference --      Peak Flow --      Pain Score 10/08/16 0750 3     Pain Loc --      Pain Edu? --      Excl. in [place]? --     Constitutional: Alert and oriented. Well appearing and in no acute distress. Eyes: Conjunctivae are normal. PERRL. EOMI. Head: Atraumatic. Nose: No congestion/rhinnorhea. Mouth/Throat: Mucous membranes are moist.   Neck: No stridor.   Cardiovascular: Normal rate, regular rhythm. Grossly normal heart sounds.  Good peripheral circulation With a pulse palpated to the fistula. No erythema or pus or breakdown around the bandages that her overlying the fistula. Bandages are clean dry and intact. No tenderness to palpation to the fistula at this time. Respiratory: Normal respiratory effort.  No retractions. Lungs CTAB. Gastrointestinal: Soft and nontender. No distention.  Musculoskeletal: No lower extremity tenderness nor edema.  No joint effusions. Neurologic:  Normal speech and language. No gross focal neurologic deficits are appreciated.  Skin:  Skin is warm, dry and intact. No rash noted. Psychiatric: Mood and affect are normal. Speech and behavior are normal.  ____________________________________________   LABS (all labs ordered are listed, but only abnormal results are displayed)  Labs Reviewed  BASIC METABOLIC PANEL - Abnormal; Notable for the following:       Result Value   Glucose, Bld 171 (*)    BUN 45 (*)     Creatinine, Ser 13.76 (*)    Calcium 7.8 (*)    GFR calc non Af Amer 3 (*)    GFR calc Af Amer 3 (*)    All other components within normal limits  CBC - Abnormal; Notable for the following:    RBC 3.72 (*)    RDW 14.7 (*)    All other components within normal limits   ____________________________________________  EKG  ED ECG REPORT I, Doran Stabler, the attending physician, personally viewed and interpreted this ECG.   Date: 10/08/2016  EKG Time: 0814  Rate: 76  Rhythm: normal sinus rhythm  Axis: normal  Intervals:none  ST&T Change: No ST segment elevation or depression. No abnormal T-wave inversion. No T-wave  peaking.  ____________________________________________  RADIOLOGY   ____________________________________________   PROCEDURES  Procedure(s) performed:   Procedures  Critical Care performed:   ____________________________________________   INITIAL IMPRESSION / ASSESSMENT AND PLAN / ED COURSE  Pertinent labs & imaging results that were available during my care of the patient were reviewed by me and considered in my medical decision making (see chart for details).  ----------------------------------------- 8:04 AM on 10/08/2016 -----------------------------------------  Case was discussed with Dr. Lorenso Courier of vascular surgery who will evaluate the patient after labs return. Patient says she does not make any urine.     ----------------------------------------- 9:06 AM on 10/08/2016 -----------------------------------------  Patient with reassuring lab work. The patient was also seen and evaluated by Dr. Lorenso Courier who scheduled the patient for fistulogram this Monday. The patient will be reporting to the medical mall this Monday at 8 AM for her fistulogram appointment with vascular. She is understanding of this plan and willing to comply. To return for any worsening or concerning symptoms. ____________________________________________   FINAL  CLINICAL IMPRESSION(S) / ED DIAGNOSES  Dialysis fistula malfunction.    NEW MEDICATIONS STARTED DURING THIS VISIT:  New Prescriptions   No medications on file     Note:  This document was prepared using Dragon voice recognition software and may include unintentional dictation errors.    Orbie Pyo, MD 10/08/16 [phone]

## 2016-10-10 ENCOUNTER — Ambulatory Visit
Admission: RE | Admit: 2016-10-10 | Discharge: 2016-10-10 | Disposition: A | Discharge status: Home / Self Care | Payer: Medicare Other | Source: Ambulatory Visit | Attending: Vascular Surgery | Admitting: Vascular Surgery

## 2016-10-10 ENCOUNTER — Encounter: Payer: Self-pay | Admitting: *Deleted

## 2016-10-10 ENCOUNTER — Encounter
Admission: RE | Disposition: A | Discharge status: Home / Self Care | Payer: Self-pay | Source: Ambulatory Visit | Attending: Vascular Surgery

## 2016-10-10 DIAGNOSIS — N186 End stage renal disease: Secondary | ICD-10-CM | POA: Diagnosis not present

## 2016-10-10 DIAGNOSIS — Z992 Dependence on renal dialysis: Secondary | ICD-10-CM | POA: Diagnosis not present

## 2016-10-10 DIAGNOSIS — D649 Anemia, unspecified: Secondary | ICD-10-CM | POA: Diagnosis not present

## 2016-10-10 DIAGNOSIS — I10 Essential (primary) hypertension: Secondary | ICD-10-CM

## 2016-10-10 DIAGNOSIS — E1122 Type 2 diabetes mellitus with diabetic chronic kidney disease: Secondary | ICD-10-CM | POA: Diagnosis not present

## 2016-10-10 DIAGNOSIS — Z7984 Long term (current) use of oral hypoglycemic drugs: Secondary | ICD-10-CM | POA: Diagnosis not present

## 2016-10-10 DIAGNOSIS — I429 Cardiomyopathy, unspecified: Secondary | ICD-10-CM | POA: Insufficient documentation

## 2016-10-10 DIAGNOSIS — B1920 Unspecified viral hepatitis C without hepatic coma: Secondary | ICD-10-CM | POA: Insufficient documentation

## 2016-10-10 DIAGNOSIS — Y832 Surgical operation with anastomosis, bypass or graft as the cause of abnormal reaction of the patient, or of later complication, without mention of misadventure at the time of the procedure: Secondary | ICD-10-CM | POA: Diagnosis not present

## 2016-10-10 DIAGNOSIS — T82858A Stenosis of vascular prosthetic devices, implants and grafts, initial encounter: Secondary | ICD-10-CM | POA: Diagnosis present

## 2016-10-10 DIAGNOSIS — I120 Hypertensive chronic kidney disease with stage 5 chronic kidney disease or end stage renal disease: Secondary | ICD-10-CM | POA: Insufficient documentation

## 2016-10-10 DIAGNOSIS — T82868A Thrombosis of vascular prosthetic devices, implants and grafts, initial encounter: Secondary | ICD-10-CM | POA: Diagnosis not present

## 2016-10-10 DIAGNOSIS — N2581 Secondary hyperparathyroidism of renal origin: Secondary | ICD-10-CM | POA: Insufficient documentation

## 2016-10-10 DIAGNOSIS — Z79899 Other long term (current) drug therapy: Secondary | ICD-10-CM | POA: Insufficient documentation

## 2016-10-10 DIAGNOSIS — E119 Type 2 diabetes mellitus without complications: Secondary | ICD-10-CM | POA: Diagnosis not present

## 2016-10-10 HISTORY — PX: A/V FISTULAGRAM: CATH118298

## 2016-10-10 LAB — GLUCOSE, CAPILLARY: Glucose-Capillary: 101 mg/dL — ABNORMAL HIGH (ref 65–99)

## 2016-10-10 LAB — POTASSIUM (ARMC VASCULAR LAB ONLY): Potassium (ARMC vascular lab): 4.4 (ref 3.5–5.1)

## 2016-10-10 SURGERY — A/V FISTULAGRAM
Anesthesia: Moderate Sedation

## 2016-10-10 MED ORDER — FENTANYL CITRATE (PF) 100 MCG/2ML IJ SOLN
INTRAMUSCULAR | Status: DC | PRN
  Administered 2016-10-10: 25 ug via INTRAVENOUS
  Administered 2016-10-10 (×2): 50 ug via INTRAVENOUS
  Administered 2016-10-10: 25 ug via INTRAVENOUS
  Administered 2016-10-10: 50 ug via INTRAVENOUS

## 2016-10-10 MED ORDER — ONDANSETRON HCL 4 MG/2ML IJ SOLN: 4.0000 mg | Freq: Four times a day (QID) | INTRAMUSCULAR | Status: DC | PRN

## 2016-10-10 MED ORDER — IOPAMIDOL (ISOVUE-300) INJECTION 61%
INTRAVENOUS | Status: DC | PRN
Start: 2016-10-10 — End: 2016-10-10
  Administered 2016-10-10: 40 mL via INTRAVENOUS

## 2016-10-10 MED ORDER — SODIUM CHLORIDE 0.9 % IV SOLN
500.0000 mL | Freq: Once | INTRAVENOUS | Status: DC | PRN
Start: 2016-10-10 — End: 2016-10-10

## 2016-10-10 MED ORDER — HEPARIN SODIUM (PORCINE) 1000 UNIT/ML IJ SOLN
INTRAMUSCULAR | Status: AC
  Filled 2016-10-10: qty 1

## 2016-10-10 MED ORDER — CEFAZOLIN IN D5W 1 GM/50ML IV SOLN: 1.0000 g | Freq: Once | INTRAVENOUS | Status: DC

## 2016-10-10 MED ORDER — ALTEPLASE 2 MG IJ SOLR
INTRAMUSCULAR | Status: AC
  Filled 2016-10-10: qty 4

## 2016-10-10 MED ORDER — HEPARIN (PORCINE) IN NACL 2-0.9 UNIT/ML-% IJ SOLN
INTRAMUSCULAR | Status: AC
  Filled 2016-10-10: qty 1500

## 2016-10-10 MED ORDER — MORPHINE SULFATE (PF) 4 MG/ML IV SOLN: 2.0000 mg | INTRAVENOUS | Status: DC | PRN

## 2016-10-10 MED ORDER — METOPROLOL TARTRATE 5 MG/5ML IV SOLN: 2.0000 mg | INTRAVENOUS | Status: DC | PRN

## 2016-10-10 MED ORDER — MIDAZOLAM HCL 2 MG/2ML IJ SOLN
INTRAMUSCULAR | Status: DC | PRN
  Administered 2016-10-10 (×2): 1 mg via INTRAVENOUS
  Administered 2016-10-10: 2 mg via INTRAVENOUS
  Administered 2016-10-10 (×2): 1 mg via INTRAVENOUS

## 2016-10-10 MED ORDER — HYDRALAZINE HCL 20 MG/ML IJ SOLN: 5.0000 mg | INTRAMUSCULAR | Status: DC | PRN

## 2016-10-10 MED ORDER — ALUM & MAG HYDROXIDE-SIMETH 200-200-20 MG/5ML PO SUSP: 15.0000 mL | ORAL | Status: DC | PRN

## 2016-10-10 MED ORDER — FENTANYL CITRATE (PF) 100 MCG/2ML IJ SOLN
INTRAMUSCULAR | Status: AC
  Filled 2016-10-10: qty 2

## 2016-10-10 MED ORDER — SODIUM CHLORIDE 0.9 % IV SOLN
INTRAVENOUS | Status: DC
  Administered 2016-10-10: 11:00:00 via INTRAVENOUS

## 2016-10-10 MED ORDER — OXYCODONE-ACETAMINOPHEN 5-325 MG PO TABS: 1.0000 | ORAL_TABLET | ORAL | Status: DC | PRN

## 2016-10-10 MED ORDER — CEFAZOLIN IN D5W 1 GM/50ML IV SOLN
INTRAVENOUS | Status: AC
  Filled 2016-10-10: qty 50

## 2016-10-10 MED ORDER — LIDOCAINE-EPINEPHRINE (PF) 2 %-1:200000 IJ SOLN
INTRAMUSCULAR | Status: AC
  Filled 2016-10-10: qty 20

## 2016-10-10 MED ORDER — ACETAMINOPHEN 325 MG RE SUPP: 325.0000 mg | RECTAL | Status: DC | PRN

## 2016-10-10 MED ORDER — GUAIFENESIN-DM 100-10 MG/5ML PO SYRP: 15.0000 mL | ORAL_SOLUTION | ORAL | Status: DC | PRN

## 2016-10-10 MED ORDER — PHENOL 1.4 % MT LIQD
1.0000 | OROMUCOSAL | Status: DC | PRN
  Filled 2016-10-10: qty 177

## 2016-10-10 MED ORDER — ACETAMINOPHEN 325 MG PO TABS: 325.0000 mg | ORAL_TABLET | ORAL | Status: DC | PRN

## 2016-10-10 MED ORDER — MIDAZOLAM HCL 5 MG/5ML IJ SOLN
INTRAMUSCULAR | Status: AC
  Filled 2016-10-10: qty 5

## 2016-10-10 MED ORDER — HEPARIN SODIUM (PORCINE) 1000 UNIT/ML IJ SOLN
INTRAMUSCULAR | Status: DC | PRN
  Administered 2016-10-10: 3000 [IU] via INTRAVENOUS

## 2016-10-10 MED ORDER — LABETALOL HCL 5 MG/ML IV SOLN: 10.0000 mg | INTRAVENOUS | Status: DC | PRN

## 2016-10-10 MED ORDER — MIDAZOLAM HCL 2 MG/2ML IJ SOLN
INTRAMUSCULAR | Status: AC
  Filled 2016-10-10: qty 2

## 2016-10-10 SURGICAL SUPPLY — 17 items
BALLN CONQUEST 8X60X75 (BALLOONS) ×2
BALLN LUTONIX AV 7X60X75 (BALLOONS) ×3
BALLOON CONQUEST 8X60X75 (BALLOONS) IMPLANT
BALLOON LUTONIX AV 7X60X75 (BALLOONS) IMPLANT
CANNULA 5F STIFF (CANNULA) ×2 IMPLANT
CATH TORCON 5FR 0.38 (CATHETERS) ×2 IMPLANT
DEVICE PRESTO INFLATION (MISCELLANEOUS) ×4 IMPLANT
DRAPE BRACHIAL (DRAPES) ×2 IMPLANT
GLIDEWIRE ADV .035X180CM (WIRE) ×2 IMPLANT
PACK ANGIOGRAPHY (CUSTOM PROCEDURE TRAY) ×3 IMPLANT
SHEATH BRITE TIP 6FRX5.5 (SHEATH) ×4 IMPLANT
SHEATH BRITE TIP 7FRX5.5 (SHEATH) ×2 IMPLANT
STENT VIABAHN 8X50X120 (Permanent Stent) ×1 IMPLANT
STENT VIABAHN5X120X8X (Permanent Stent) ×1 IMPLANT
SUT MNCRL AB 4-0 PS2 18 (SUTURE) ×2 IMPLANT
TOWEL OR 17X26 4PK STRL BLUE (TOWEL DISPOSABLE) ×2 IMPLANT
WIRE G 018X200 V18 (WIRE) ×2 IMPLANT

## 2016-10-10 NOTE — H&P (Signed)
[facility] History & Physical Update  The patient was interviewed and re-examined.  The patient's previous History and Physical has been reviewed and is unchanged.  There is no change in the plan of care. We plan to proceed with the scheduled procedure.  Leotis Pain, MD  10/10/2016, 11:52 AM

## 2016-10-10 NOTE — Op Note (Signed)
[facility]    OPERATIVE NOTE   PROCEDURE: 1.   Right brachiocephalic arteriovenous fistula cannulation under ultrasound guidance 2.   Right arm fistulagram including central venogram 3.   Percutaneous transluminal angioplasty of the mid upper arm cephalic vein stenosis with 7 mm diameter by 6 cm length Lutonix drug-coated angioplasty balloon 4.   Stent placement for greater than 50% residual stenosis after angioplasty to the mid upper arm cephalic vein with 62m diameter by 5 cm length Viabahn stent  PRE-OPERATIVE DIAGNOSIS: 1. ESRD 2. Poorly functional right brachiocephalic AVF  POST-OPERATIVE DIAGNOSIS: same as above   SURGEON: JLeotis Pain MD  ANESTHESIA: local with MCS  ESTIMATED BLOOD LOSS: 10 cc  FINDING(S): 1. High-grade near occlusive stenosis is present and what appears to be the buttonhole site for venous access. The remainder of the AV fistula. Patent without significant stenosis. 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  SPECIMEN(S):  None  CONTRAST: 40 cc  FLUORO TIME: 3.1 minutes  MODERATE CONSCIOUS SEDATION TIME: Approximately 25 minutes with 6 mg of Versed and 200 mcg of Fentanyl   INDICATIONS: RMadoraA Gatt is a 61y.o. female who presents with malfunctioning right brachiocephalic arteriovenous fistula.  The patient is scheduled for right arm fistulagram.  The patient is aware the risks include but are not limited to: bleeding, infection, thrombosis of the cannulated access, and possible anaphylactic reaction to the contrast.  The patient is aware of the risks of the procedure and elects to proceed forward.  DESCRIPTION: After full informed written consent was obtained, the patient was brought back to the angiography suite and placed supine upon the angiography table.  The patient was connected to monitoring equipment. Moderate conscious sedation was administered with a face to face encounter with the patient throughout the procedure  with my supervision of the RN administering medicines and monitoring the patient's vital signs and mental status throughout from the start of the procedure until the patient was taken to the recovery room. The right arm was prepped and draped in the standard fashion for a percutaneous access intervention.  Under ultrasound guidance, the right brachiocephalic arteriovenous fistula was cannulated with a micropuncture needle under direct ultrasound guidance and a permanent image was performed.  The microwire was advanced into the fistula and the needle was exchanged for the a microsheath.  I then upsized to a 6 Fr Sheath and imaging was performed.  Hand injections were completed to image the access including the central venous system. This demonstrated a high-grade near occlusive stenosis is present and what appears to be the buttonhole site for venous access. The remainder of the AV fistula. Patent without significant stenosis. 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  Based on the images, this patient will need intervention to this area. I then gave the patient 3000 units of intravenous heparin.  I then crossed the stenosis with an Advantage wire.  Based on the imaging, a 7 mm x 6 cm  Lutonix drug coated angioplasty balloon was selected.  The balloon was centered around the mid upper arm cephalic vein stenosis and inflated to 12 ATM for 1 minute(s).  On completion imaging, a 70-75% residual stenosis was present.  I elected to treat this area with a stent. I exchanged for 7 French sheath and a 0.018 wire. An 8 mm diameter by 5 cm length Viabahn stent was then deployed across the lesion. This was postdilated with an 8 mm diameter high pressure balloon with only about a 10-15%  residual stenosis present.   Based on the completion imaging, no further intervention is necessary.  The wire and balloon were removed from the sheath.  A 4-0 Monocryl purse-string suture was sewn around the sheath.  The sheath was  removed while tying down the suture.  A sterile bandage was applied to the puncture site.  COMPLICATIONS: None  CONDITION: Stable   Leotis Pain  10/10/2016 12:19 PM   This note was created with Dragon Medical transcription system. Any errors in dictation are purely unintentional.

## 2016-10-10 NOTE — Progress Notes (Signed)
Patient here today for right arm fistulogram per Dr Lucky Cowboy. Will monitor vitals as well as etco2 during and throughout entire procedure.

## 2016-10-11 ENCOUNTER — Encounter: Payer: Self-pay | Admitting: Vascular Surgery

## 2016-10-11 ENCOUNTER — Ambulatory Visit: Admit: 2016-10-11 | Payer: Medicare Other | Admitting: Vascular Surgery

## 2016-10-11 SURGERY — A/V FISTULAGRAM
Anesthesia: Moderate Sedation

## 2016-10-12 NOTE — H&P (Signed)
[facility] Admission History & Physical  MRN : [id number]  Anna [name] is a 61 y.o. (1956-05-27) female who presents with chief complaint of No chief complaint on file. Marland Kitchen  History of Present Illness: I am asked to evaluate the patient by the dialysis center. The patient was sent here because they were unable to achieve adequate dialysis this morning. Furthermore the Center states there is very poor thrill and bruit. The patient states there there have been increasing problems with the access, such as "pulling clots" during dialysis and prolonged bleeding after decannulation. The patient estimates these problems have been going on for several weeks. The patient is unaware of any other change.  Patient denies pain or tenderness overlying the access.  There is no pain with dialysis.  The patient denies hand pain or finger pain consistent with steal syndrome.   There have been past interventions or declots of this access.  The patient is not chronically hypotensive on dialysis.  No current facility-administered medications for this encounter.    Current Outpatient Prescriptions  Medication Sig Dispense Refill  . calcium acetate (PHOSLO) 667 MG capsule Take 2,001 mg by mouth 3 (three) times daily with meals. May also take 2001mg  with each snack    . cinacalcet (SENSIPAR) 90 MG tablet Take 90 mg by mouth daily.    Marland Kitchen glipiZIDE (GLUCOTROL XL) 2.5 MG 24 hr tablet Take 2.5 mg by mouth daily.    Marland Kitchen ibuprofen (ADVIL,MOTRIN) 800 MG tablet Take 800 mg by mouth every 8 (eight) hours as needed for mild pain.     Marland Kitchen levothyroxine (SYNTHROID, LEVOTHROID) 50 MCG tablet Take 50 mcg by mouth daily.    . meloxicam (MOBIC) 15 MG tablet Take 1 tablet (15 mg total) by mouth daily. (Patient not taking: Reported on 10/08/2016) 30 tablet 0    Past Medical History:  Diagnosis Date  . Anemia   . AV fistula (HCC)    in place-rt upper arm  . Cardiomyopathy ([place])   . Diabetes mellitus type  2 in obese ([place])   . ESRD (end stage renal disease) ([place])   . Full dentures    lost them-no teeth  . Hepatitis C   . Hyperparathyroidism, secondary ([place])   . Malignant hypertension   . Morbid obesity ([place])     Past Surgical History:  Procedure Laterality Date  . A/V SHUNTOGRAM N/A 10/10/2016   Procedure: A/V Fistulagram;  Surgeon: Algernon Huxley, MD;  Location: [facility];  Service: Cardiovascular;  Laterality: N/A;  . AV FISTULA PLACEMENT  2013   right upper arm  . AV FISTULA PLACEMENT  2008   rt upper arm  . AV FISTULA PLACEMENT  2008   left  . AV FISTULA REPAIR  2008   left taken out  . CARPAL TUNNEL RELEASE Right 03/10/2014   Procedure: CARPAL TUNNEL RELEASE;  Surgeon: Tennis Must, MD;  Location: [facility];  Service: Orthopedics;  Laterality: Right;  . CHOLECYSTECTOMY  2003   lap choli  . FISTULOGRAM N/A 10/26/2011   Procedure: FISTULOGRAM;  Surgeon: Rosetta Posner, MD;  Location: [facility];  Service: Cardiovascular;  Laterality: N/A;  . INCISIONAL HERNIA REPAIR  2007    Social History Social History  Substance Use Topics  . Smoking status: Never Smoker  . Smokeless tobacco: Never Used  . Alcohol use No    Family History  No family history of bleeding or clotting disorders, autoimmune disease or porphyria  Allergies  Allergen Reactions  . Latex Itching    LATEX GLOVES     REVIEW OF SYSTEMS (Negative unless checked)  Constitutional: [] Weight loss  [] Fever  [] Chills Cardiac: [] Chest pain   [] Chest pressure   [] Palpitations   [] Shortness of breath when laying flat   [] Shortness of breath at rest   [x] Shortness of breath with exertion. Vascular:  [] Pain in legs with walking   [] Pain in legs at rest   [] Pain in legs when laying flat   [] Claudication   [] Pain in feet when walking  [] Pain in feet at rest  [] Pain in feet when laying flat   [] History of DVT   [] Phlebitis   [] Swelling in legs   [] Varicose veins   [] Non-healing ulcers Pulmonary:   [] Uses home oxygen    [] Productive cough   [] Hemoptysis   [] Wheeze  [] COPD   [] Asthma Neurologic:  [] Dizziness  [] Blackouts   [] Seizures   [] History of stroke   [] History of TIA  [] Aphasia   [] Temporary blindness   [] Dysphagia   [] Weakness or numbness in arms   [] Weakness or numbness in legs Musculoskeletal:  [] Arthritis   [] Joint swelling   [] Joint pain   [] Low back pain Hematologic:  [] Easy bruising  [] Easy bleeding   [] Hypercoagulable state   [] Anemic  [] Hepatitis Gastrointestinal:  [] Blood in stool   [] Vomiting blood  [] Gastroesophageal reflux/heartburn   [] Difficulty swallowing. Genitourinary:  [x] Chronic kidney disease   [] Difficult urination  [] Frequent urination  [] Burning with urination   [] Blood in urine Skin:  [] Rashes   [] Ulcers   [] Wounds Psychological:  [] History of anxiety   []  History of major depression.  Physical Examination  Vitals:   10/10/16 1210 10/10/16 1216 10/10/16 1230 10/10/16 1245  BP:   106/67 (!) 152/81  Pulse:   73   Resp:   13 14  SpO2: 100% 100% 97% 97%   There is no height or weight on file to calculate BMI. Gen: WD/WN, NAD Head: [place]/AT, No temporalis wasting. Prominent temp pulse not noted. Ear/Nose/Throat: Hearing grossly intact, nares w/o erythema or drainage, oropharynx w/o Erythema/Exudate,  Eyes: Conjunctiva clear, sclera non-icteric Neck: Trachea midline.  No JVD.  Pulmonary:  Good air movement, respirations not labored, no use of accessory muscles.  Cardiac: RRR, normal S1, S2. Vascular: AVF is pulsatile in the right arm Vessel Right Left  Radial Palpable Palpable  Ulnar Not Palpable Not Palpable  Brachial Palpable Palpable  Carotid Palpable, without bruit Palpable, without bruit  Gastrointestinal: soft, non-tender/non-distended. No guarding/reflex.  Musculoskeletal: M/S 5/5 throughout.  Extremities without ischemic changes.  No deformity or atrophy.  Neurologic: Sensation grossly intact in extremities.  Symmetrical.  Speech is fluent. Motor exam as listed  above. Psychiatric: Judgment intact, Mood & affect appropriate for pt's clinical situation. Dermatologic: No rashes or ulcers noted.  No cellulitis or open wounds. Lymph : No Cervical, Axillary, or Inguinal lymphadenopathy.   CBC Lab Results  Component Value Date   WBC 5.3 10/08/2016   HGB 12.1 10/08/2016   HCT 35.0 10/08/2016   MCV 94.0 10/08/2016   PLT 211 10/08/2016    BMET    Component Value Date/Time   NA 141 10/08/2016 0758   NA 137 08/31/2011 1625   K 3.6 10/08/2016 0758   K 5.1 06/16/2012 0610   CL 102 10/08/2016 0758   CL 94 (L) 08/31/2011 1625   CO2 25 10/08/2016 0758   CO2 27 08/31/2011 1625   GLUCOSE 171 (H) 10/08/2016 0758   GLUCOSE 259 (  H) 08/31/2011 1625   BUN 45 (H) 10/08/2016 0758   BUN 41 (H) 08/31/2011 1625   CREATININE 13.76 (H) 10/08/2016 0758   CREATININE 11.57 (H) 08/31/2011 1625   CALCIUM 7.8 (L) 10/08/2016 0758   CALCIUM 9.8 08/31/2011 1625   GFRNONAA 3 (L) 10/08/2016 0758   GFRNONAA 4 (L) 08/31/2011 1625   GFRAA 3 (L) 10/08/2016 0758   GFRAA 4 (L) 08/31/2011 1625   Estimated Creatinine Clearance: 4.6 mL/min (by C-G formula based on SCr of 13.76 mg/dL (H)).  COAG No results found for: INR, PROTIME  Radiology No results found.  Assessment/Plan 1.  Complication dialysis device with thrombosis AV access:  Patient's right arm dialysis access is malfunctioning. The patient will undergo angiography and correction of any problems using interventional techniques with the hope of restoring function to the access.  The risks and benefits were described to the patient.  All questions were answered.  The patient agrees to proceed with angiography and intervention. Potassium will be drawn to ensure that it is an appropriate level prior to performing intervention. 2.  End-stage renal disease requiring hemodialysis:  Patient will continue dialysis therapy without further interruption if a successful intervention is not achieved then a tunneled catheter will  be placed. Dialysis has already been arranged. 3.  Hypertension:  Patient will continue medical management; nephrology is following no changes in oral medications. 4. Diabetes mellitus:  Glucose will be monitored and oral medications been held this morning once the patient has undergone the patient's procedure po intake will be reinitiated and again Accu-Cheks will be used to assess the blood glucose level and treat as needed. The patient will be restarted on the patient's usual hypoglycemic regime    Leotis Pain, MD  10/12/2016 1:09 PM

## 2016-11-23 ENCOUNTER — Encounter (INDEPENDENT_AMBULATORY_CARE_PROVIDER_SITE_OTHER): Payer: Self-pay | Admitting: Vascular Surgery

## 2016-11-23 ENCOUNTER — Encounter (INDEPENDENT_AMBULATORY_CARE_PROVIDER_SITE_OTHER): Payer: Medicare Other

## 2019-07-21 ENCOUNTER — Emergency Department: Payer: Medicare Other

## 2019-07-21 ENCOUNTER — Other Ambulatory Visit: Payer: Self-pay

## 2019-07-21 ENCOUNTER — Encounter: Payer: Self-pay | Admitting: Intensive Care

## 2019-07-21 ENCOUNTER — Emergency Department
Admission: EM | Admit: 2019-07-21 | Discharge: 2019-07-21 | Disposition: A | Discharge status: Home / Self Care | Payer: Medicare Other | Attending: Emergency Medicine | Admitting: Emergency Medicine

## 2019-07-21 DIAGNOSIS — I120 Hypertensive chronic kidney disease with stage 5 chronic kidney disease or end stage renal disease: Secondary | ICD-10-CM | POA: Insufficient documentation

## 2019-07-21 DIAGNOSIS — E8779 Other fluid overload: Secondary | ICD-10-CM | POA: Insufficient documentation

## 2019-07-21 DIAGNOSIS — Z20828 Contact with and (suspected) exposure to other viral communicable diseases: Secondary | ICD-10-CM | POA: Insufficient documentation

## 2019-07-21 DIAGNOSIS — Z91040 Latex allergy status: Secondary | ICD-10-CM | POA: Insufficient documentation

## 2019-07-21 DIAGNOSIS — Z79899 Other long term (current) drug therapy: Secondary | ICD-10-CM | POA: Insufficient documentation

## 2019-07-21 DIAGNOSIS — Z87891 Personal history of nicotine dependence: Secondary | ICD-10-CM | POA: Insufficient documentation

## 2019-07-21 DIAGNOSIS — E1122 Type 2 diabetes mellitus with diabetic chronic kidney disease: Secondary | ICD-10-CM | POA: Insufficient documentation

## 2019-07-21 DIAGNOSIS — N186 End stage renal disease: Secondary | ICD-10-CM | POA: Diagnosis not present

## 2019-07-21 DIAGNOSIS — R0602 Shortness of breath: Secondary | ICD-10-CM | POA: Diagnosis present

## 2019-07-21 DIAGNOSIS — Z992 Dependence on renal dialysis: Secondary | ICD-10-CM | POA: Insufficient documentation

## 2019-07-21 LAB — BASIC METABOLIC PANEL
Anion gap: 16 — ABNORMAL HIGH (ref 5–15)
BUN: 28 mg/dL — ABNORMAL HIGH (ref 8–23)
CO2: 25 mmol/L (ref 22–32)
Calcium: 10.3 mg/dL (ref 8.9–10.3)
Chloride: 100 mmol/L (ref 98–111)
Creatinine, Ser: 12.12 mg/dL — ABNORMAL HIGH (ref 0.44–1.00)
GFR calc Af Amer: 3 mL/min — ABNORMAL LOW (ref >60)
GFR calc non Af Amer: 3 mL/min — ABNORMAL LOW (ref >60)
Glucose, Bld: 113 mg/dL — ABNORMAL HIGH (ref 70–99)
Potassium: 3.8 mmol/L (ref 3.5–5.1)
Sodium: 141 mmol/L (ref 135–145)

## 2019-07-21 LAB — CBC
HCT: 33.6 % — ABNORMAL LOW (ref 36.0–46.0)
Hemoglobin: 11.1 g/dL — ABNORMAL LOW (ref 12.0–15.0)
MCH: 29 pg (ref 26.0–34.0)
MCHC: 33 g/dL (ref 30.0–36.0)
MCV: 87.7 fL (ref 80.0–100.0)
Platelets: 227 10*3/uL (ref 150–400)
RBC: 3.83 MIL/uL — ABNORMAL LOW (ref 3.87–5.11)
RDW: 13.5 % (ref 11.5–15.5)
WBC: 5.2 10*3/uL (ref 4.0–10.5)
nRBC: 0 % (ref 0.0–0.2)

## 2019-07-21 LAB — BRAIN NATRIURETIC PEPTIDE: B Natriuretic Peptide: 984 pg/mL — ABNORMAL HIGH (ref 0.0–100.0)

## 2019-07-21 LAB — POC SARS CORONAVIRUS 2 AG: SARS Coronavirus 2 Ag: NEGATIVE

## 2019-07-21 LAB — TROPONIN I (HIGH SENSITIVITY)
Troponin I (High Sensitivity): 58 ng/L — ABNORMAL HIGH (ref <18)
Troponin I (High Sensitivity): 53 ng/L — ABNORMAL HIGH (ref <18)

## 2019-07-21 NOTE — ED Notes (Signed)
Pt ambulated around the room without assistance. Pt began to breathe heavily. Pt desat to 88% on RA.

## 2019-07-21 NOTE — ED Notes (Signed)
Pt moved to room 37 to await dialysis

## 2019-07-21 NOTE — Progress Notes (Signed)
Spoke with RN about placing a piv for this patient. At this time the patient will not need a PIV. The patient is going to dialysis and pending lab results after dialysis, if the patient still needs a piv then another IV team consult will be placed.

## 2019-07-21 NOTE — Progress Notes (Signed)
   07/21/19 1726  Neurological  Level of Consciousness Alert  Orientation Level Oriented X4  Respiratory  Respiratory Pattern Regular;Labored;Dyspnea at rest  Bilateral Breath Sounds Clear;Diminished  Cardiac  Pulse Regular  Heart Sounds S1, S2  ECG Monitor Yes  Pt in stretcher, stable for HD tx AVF +/+ vitals stable UFG 3L.

## 2019-07-21 NOTE — Discharge Instructions (Addendum)
Go to your next dialysis session on Tuesday as scheduled.  Follow-up with your regular doctors.  Return to the ER immediately for new, worsening, or persistent severe shortness of breath, chest pain, weakness, high fever, or any other new or worsening symptoms that concern you.

## 2019-07-21 NOTE — ED Provider Notes (Signed)
-----------------------------------------   10:32 PM on 07/21/2019 -----------------------------------------  I took over care on this patient from Dr. Cinda Quest.  The patient was evaluated earlier today and diagnosed with likely fluid overload related to missed dialysis.  She was sent for dialysis, and the plan was to reassess her afterwards, get repeat chest x-ray and labs, and likely discharge home if her symptoms had resolved.  The patient had dialysis without complications.  She is now back in the ED.  On reassessment, she appears very comfortable.  O2 saturation is 100% on room air, and the patient has no increased work of breathing and is speaking in full sentences.  Her blood pressure is still elevated but is substantially improved from when she arrived and she has no symptoms of hypertensive urgency.  X-ray now after dialysis shows no significant changes, however I would expect this to somewhat lag behind the patient's clinical status.  The patient has difficult IV access.  The plan that was told to me was that she would have repeat labs drawn at the end of dialysis, but this apparently did not happen.  I had an extensive discussion with the patient about the plan.  She states she feels well and would like to go home.  I explained to her that her symptoms are likely related to fluid overload, and given that she is feeling better, oxygenating normally and her blood pressure is improved, there is no evidence of acute cardiac issue.  The patient had 2 troponins in the 50s earlier today with no significant change.  Her BNP was elevated prior to dialysis.  EKG was nonischemic.  At this time, I do not feel that a repeat troponin or BNP would change my management given that the patient's clinical status has improved so significantly.  I discussed this with the patient, and offered to repeat the labs if she preferred, however the patient expressed a strong preference to go home.  She states she feels well  and has dialysis scheduled in 2 days.  She is stable for discharge at this time.  I gave her thorough return precautions and she expressed understanding.   Arta Silence, MD 07/21/19 2245

## 2019-07-21 NOTE — Consult Note (Signed)
[facility]  CONSULT NOTE    Date: 07/21/2019                  Patient Name:  Anna [name]  MRN: [id number]  DOB: 06-27-1956  Age / Sex: 63 y.o., female         PCP: Elmarie Shiley, MD                 Service Requesting Consult: Dr. Cinda Quest                 Reason for Consult: End Stage Renal Disease            History of Present Illness: Ms. Emanda Soo Lannom presents to [facility] ED with shortness of breath and hypertensive emergency. Patient states that she was not feeling well and did not go to dialysis yesterday. Patient did not take any medications today.    Medications: Outpatient medications: (Not in a hospital admission)   Current medications: No current facility-administered medications for this encounter.    Current Outpatient Medications  Medication Sig Dispense Refill  . amLODipine (NORVASC) 10 MG tablet Take 10 mg by mouth at bedtime.    Lorin Picket 1 GM 210 MG(Fe) tablet Take 2-3 tablets by mouth 5 (five) times daily as needed.    . calcium acetate (PHOSLO) 667 MG capsule Take 2,001 mg by mouth 3 (three) times daily with meals. May also take 2001mg  with each snack    . cinacalcet (SENSIPAR) 90 MG tablet Take 90 mg by mouth daily.    . cyclobenzaprine (FLEXERIL) 5 MG tablet Take 5 mg by mouth at bedtime as needed.    Marland Kitchen glipiZIDE (GLUCOTROL XL) 2.5 MG 24 hr tablet Take 2.5 mg by mouth daily.    Marland Kitchen ibuprofen (ADVIL,MOTRIN) 800 MG tablet Take 800 mg by mouth every 8 (eight) hours as needed for mild pain.     Marland Kitchen levothyroxine (SYNTHROID, LEVOTHROID) 50 MCG tablet Take 50 mcg by mouth daily.    Marland Kitchen losartan (COZAAR) 50 MG tablet Take 50 mg by mouth at bedtime.    . meloxicam (MOBIC) 15 MG tablet Take 1 tablet (15 mg total) by mouth daily. (Patient not taking: Reported on 10/08/2016) 30 tablet 0      Allergies: Allergies  Allergen Reactions  . Latex Itching    LATEX GLOVES      Past Medical History: Past Medical History:  Diagnosis Date  . Anemia    . AV fistula (HCC)    in place-rt upper arm  . Cardiomyopathy ([place])   . Diabetes mellitus type 2 in obese ([place])   . ESRD (end stage renal disease) ([place])   . Full dentures    lost them-no teeth  . Hepatitis C   . Hyperparathyroidism, secondary ([place])   . Malignant hypertension   . Morbid obesity ([place])      Past Surgical History: Past Surgical History:  Procedure Laterality Date  . A/V FISTULAGRAM N/A 10/10/2016   Procedure: A/V Fistulagram;  Surgeon: Algernon Huxley, MD;  Location: [place] [facility];  Service: Cardiovascular;  Laterality: N/A;  . AV FISTULA PLACEMENT  2013   right upper arm  . AV FISTULA PLACEMENT  2008   rt upper arm  . AV FISTULA PLACEMENT  2008   left  . AV FISTULA REPAIR  2008   left taken out  . CARPAL TUNNEL RELEASE Right 03/10/2014   Procedure: CARPAL TUNNEL RELEASE;  Surgeon: Tennis Must, MD;  Location: [place];  Service: Orthopedics;  Laterality: Right;  . CHOLECYSTECTOMY  2003   lap choli  . FISTULOGRAM N/A 10/26/2011   Procedure: FISTULOGRAM;  Surgeon: Rosetta Posner, MD;  Location: [facility] CATH [facility];  Service: Cardiovascular;  Laterality: N/A;  . INCISIONAL HERNIA REPAIR  2007     Family History: History reviewed. No pertinent family history.   Social History: Social History   Socioeconomic History  . Marital status: Single    Spouse name: Not on file  . Number of children: Not on file  . Years of education: Not on file  . Highest education level: Not on file  Occupational History  . Not on file  Social Needs  . Financial resource strain: Not on file  . Food insecurity    Worry: Not on file    Inability: Not on file  . Transportation needs    Medical: Not on file    Non-medical: Not on file  Tobacco Use  . Smoking status: Former Research scientist (life sciences)  . Smokeless tobacco: Never Used  Substance and Sexual Activity  . Alcohol use: No  . Drug use: No    Comment: hx crack-last 1999  . Sexual activity: Not on file  Lifestyle  . Physical activity     Days per week: Not on file    Minutes per session: Not on file  . Stress: Not on file  Relationships  . Social Herbalist on phone: Not on file    Gets together: Not on file    Attends religious service: Not on file    Active member of club or organization: Not on file    Attends meetings of clubs or organizations: Not on file    Relationship status: Not on file  . Intimate partner violence    Fear of current or ex partner: Not on file    Emotionally abused: Not on file    Physically abused: Not on file    Forced sexual activity: Not on file  Other Topics Concern  . Not on file  Social History Narrative  . Not on file     Review of Systems: Review of Systems  Constitutional: Negative.  Negative for chills, diaphoresis, fever, malaise/fatigue and weight loss.  HENT: Negative.  Negative for congestion, ear discharge, ear pain, hearing loss, nosebleeds, sinus pain, sore throat and tinnitus.   Eyes: Negative.  Negative for blurred vision, double vision, photophobia, pain, discharge and redness.  Respiratory: Positive for cough, shortness of breath and wheezing. Negative for hemoptysis, sputum production and stridor.   Cardiovascular: Negative.  Negative for chest pain, palpitations, orthopnea, claudication, leg swelling and PND.  Gastrointestinal: Negative.  Negative for abdominal pain, blood in stool, constipation, diarrhea, heartburn, melena, nausea and vomiting.  Genitourinary: Negative.  Negative for dysuria, flank pain, frequency, hematuria and urgency.  Musculoskeletal: Negative.  Negative for back pain, falls, joint pain, myalgias and neck pain.  Skin: Negative.  Negative for itching and rash.  Neurological: Negative.  Negative for dizziness, tingling, tremors, sensory change, speech change, focal weakness, seizures, loss of consciousness, weakness and headaches.  Endo/Heme/Allergies: Negative.  Negative for environmental allergies and polydipsia. Does not bruise/bleed  easily.  Psychiatric/Behavioral: Negative.  Negative for depression, hallucinations, memory loss, substance abuse and suicidal ideas. The patient is not nervous/anxious and does not have insomnia.     Vital Signs: Blood pressure (!) 209/112, pulse 99, temperature 98.5 F (36.9 C), temperature source Oral, resp. rate 20, height 5\' 1"  (1.549  m), weight 86.2 kg, SpO2 100 %.  Weight trends: Filed Weights   07/21/19 1010  Weight: 86.2 kg    Physical Exam: General: NAD, on stretcher  Head: Normocephalic, atraumatic. Moist oral mucosal membranes  Eyes: Anicteric, PERRL  Neck: Supple, trachea midline  Lungs:  Clear to auscultation, 2 L [place] O2  Heart: Regular rate and rhythm  Abdomen:  Soft, nontender,   Extremities: no peripheral edema.  Neurologic: Nonfocal, moving all four extremities  Skin: No lesions  Access: Right AVF     Lab results: Basic Metabolic Panel: Recent Labs  Lab 07/21/19 1014  NA 141  K 3.8  CL 100  CO2 25  GLUCOSE 113*  BUN 28*  CREATININE 12.12*  CALCIUM 10.3    Liver Function Tests: No results for input(s): AST, ALT, ALKPHOS, BILITOT, PROT, ALBUMIN in the last 168 hours. No results for input(s): LIPASE, AMYLASE in the last 168 hours. No results for input(s): AMMONIA in the last 168 hours.  CBC: Recent Labs  Lab 07/21/19 1014  WBC 5.2  HGB 11.1*  HCT 33.6*  MCV 87.7  PLT 227    Cardiac Enzymes: No results for input(s): CKTOTAL, CKMB, CKMBINDEX, TROPONINI in the last 168 hours.  BNP: Invalid input(s): POCBNP  CBG: No results for input(s): GLUCAP in the last 168 hours.  Microbiology: Results for orders placed or performed during the hospital encounter of 08/01/07  Cath Tip Culture     Status: None   Collection Time: 08/01/07  1:30 PM   Specimen: Catheter Tip  Result Value Ref Range Status   Specimen Description CATH TIP HEMODIALYSIS CATHETER  Final   Special Requests NONE  Final   Culture   Final    >100 COLONIES STAPHYLOCOCCUS  SPECIES (COAGULASE NEGATIVE) Note: RIFAMPIN AND GENTAMICIN SHOULD NOT BE USED AS SINGLE DRUGS FOR TREATMENT OF STAPH INFECTIONS. This organism DOES NOT demonstrate inducible Clindamycin resistance in vitro. 4 COLONIES STAPHYLOCOCCUS AUREUS Note: RIFAMPIN AND GENTAMICIN SHOULD NOT BE USED AS SINGLE DRUGS FOR TREATMENT OF STAPH INFECTIONS.   Report Status 08/05/2007 FINAL  Final   Organism ID, Bacteria STAPHYLOCOCCUS SPECIES (COAGULASE NEGATIVE)  Final   Organism ID, Bacteria STAPHYLOCOCCUS AUREUS  Final      Susceptibility   Staphylococcus aureus - MIC    CLINDAMYCIN <=0.25 Sensitive     ERYTHROMYCIN <=0.25 Sensitive     GENTAMICIN <=0.5 Sensitive     LEVOFLOXACIN 0.25 Sensitive     OXACILLIN <=0.25 Sensitive     PENICILLIN 0.25 Resistant     RIFAMPIN <=0.5 Sensitive     TRIMETH/SULFA <=10 Sensitive     VANCOMYCIN 1 Sensitive     TETRACYCLINE <=1 Sensitive     MOXIFLOXACIN <=0.25 Sensitive    Staphylococcus species (coagulase negative) - MIC    CLINDAMYCIN 0.5 Sensitive     ERYTHROMYCIN >=8 Resistant     GENTAMICIN <=0.5 Sensitive     LEVOFLOXACIN >=8 Resistant     OXACILLIN >=4 Resistant     PENICILLIN >=0.5 Resistant     RIFAMPIN <=0.5 Sensitive     VANCOMYCIN 2 Sensitive     TETRACYCLINE >=16 Resistant     Coagulation Studies: No results for input(s): LABPROT, INR in the last 72 hours.  Urinalysis: No results for input(s): COLORURINE, LABSPEC, PHURINE, GLUCOSEU, HGBUR, BILIRUBINUR, KETONESUR, PROTEINUR, UROBILINOGEN, NITRITE, LEUKOCYTESUR in the last 72 hours.  Invalid input(s): APPERANCEUR    Imaging: Dg Chest 2 View  Result Date: 07/21/2019 CLINICAL DATA:  Increasing shortness of breath. EXAM: CHEST -  2 VIEW COMPARISON:  Chest x-ray of 03/10/2014 FINDINGS: Cardiomediastinal contours are enlarged even accounting for differences in technique and portable evaluation. Increased interstitial markings are seen bilaterally. Dense basilar opacification and blunting of right  and left costophrenic angle. On the lateral view there is suggestion of lower lobe consolidation. No acute bone finding. IMPRESSION: Findings of may represent heart failure or volume overload. Basilar consolidation suspicious for concomitant pneumonia, atypical infection is included in the differential. Electronically Signed   By: Zetta Bills M.D.   On: 07/21/2019 12:12      Assessment & Plan: Ms. JELISHA RETALLICK is a 63 y.o. black female with end stage renal disease on hemodialysis for last 22 years, hypertension, diabetes mellitus type II, hypothyroidism, who was admitted to [facility] on 07/21/2019 for SOB Patient missed dialysis yesterday. Patient presents with shortness of breath, hypertensive urgency and pulmonary edema.   Patient's son passed away a few months ago.   [facility]) TTS [facility] Right AVF  1. End Stage Renal Disease: placed on emergent hemodialysis due to pulmonary edema and hypoxia  2. Hypertension: urgency. Patient did not take her medications today.   3. Anemia of chronic kidney disease: Hemoglobin 11.1. No indication for EPO during today's treatment.   4. Secondary Hyperparathyroidism: with hypercalcemia - [place] with meals.   LOS: 0 Caedan Sumler 12/6/20207:11 PM

## 2019-07-21 NOTE — ED Notes (Signed)
Pt taken to dialysis by transporter.

## 2019-07-21 NOTE — Progress Notes (Signed)
   07/21/19 2115  Vital Signs  Temp 98.6 F (37 C)  Temp Source Oral  Pulse Rate 100  Pulse Rate Source Monitor  Resp 20  BP (!) 200/50  Oxygen Therapy  SpO2 100 %  During Hemodialysis Assessment  Blood Flow Rate (mL/min) 400 mL/min  Arterial Pressure (mmHg) -140 mmHg  Venous Pressure (mmHg) 120 mmHg  Transmembrane Pressure (mmHg) 600 mmHg  Ultrafiltration Rate (mL/min) 590 mL/min  Dialysate Flow Rate (mL/min) 600 ml/min  Conductivity: Machine  14.1  HD Safety Checks Performed Yes  KECN 76.9 KECN  Dialysis Fluid Bolus Normal Saline  Bolus Amount (mL) 250 mL  Intra-Hemodialysis Comments Progressing as prescribed  Post-Hemodialysis Assessment  Rinseback Volume (mL) 250 mL  KECN 76.9 V  Dialyzer Clearance Clear  Duration of HD Treatment -hour(s) 3.5 hour(s)  Hemodialysis Intake (mL) 500 mL  UF Total -Machine (mL) 3000 mL  Net UF (mL) 2500 mL  Tolerated HD Treatment Yes  AVG/AVF Arterial Site Held (minutes) 8 minutes  AVG/AVF Venous Site Held (minutes) 8 minutes  hd tx tolerated well avf +/+ ufg 2.5L stable for d/c

## 2019-07-21 NOTE — ED Notes (Signed)
Patient transported to X-ray 

## 2019-07-21 NOTE — ED Triage Notes (Addendum)
Patient c/o sob and cough since Friday. Denies fever. Dialysis patient T, TH, and SAT. Did not go to dialysis yesterday because "I didn't feel like it and stayed in bed." Dialysis access on right arm

## 2019-07-21 NOTE — ED Provider Notes (Addendum)
[facility] Emergency Department Provider Note   ____________________________________________   First MD Initiated Contact with Patient 07/21/19 1056     (approximate)  I have reviewed the triage vital signs and the nursing notes.   HISTORY  Chief Complaint Shortness of Breath    HPI Anna [name] is a 63 y.o. female reports she did not feel well yesterday so she did not go to dialysis she just laid in bed.  She says she is short of breath today.  She is not coughing and does not have a fever.  She reports that she was recently started on a new or more high-strength blood pressure pill but she does not remember what it was.  She says her shoulders have been hurting because of arthritis and this gets worse at the end of dialysis.  Patient did not take any of her medicines today.         Past Medical History:  Diagnosis Date  . Anemia   . AV fistula (HCC)    in place-rt upper arm  . Cardiomyopathy ([place])   . Diabetes mellitus type 2 in obese ([place])   . ESRD (end stage renal disease) ([place])   . Full dentures    lost them-no teeth  . Hepatitis C   . Hyperparathyroidism, secondary ([place])   . Malignant hypertension   . Morbid obesity ([place])     There are no active problems to display for this patient.   Past Surgical History:  Procedure Laterality Date  . A/V FISTULAGRAM N/A 10/10/2016   Procedure: A/V Fistulagram;  Surgeon: Algernon Huxley, MD;  Location: [place] [facility];  Service: Cardiovascular;  Laterality: N/A;  . AV FISTULA PLACEMENT  2013   right upper arm  . AV FISTULA PLACEMENT  2008   rt upper arm  . AV FISTULA PLACEMENT  2008   left  . AV FISTULA REPAIR  2008   left taken out  . CARPAL TUNNEL RELEASE Right 03/10/2014   Procedure: CARPAL TUNNEL RELEASE;  Surgeon: Tennis Must, MD;  Location: [place];  Service: Orthopedics;  Laterality: Right;  . CHOLECYSTECTOMY  2003   lap choli  . FISTULOGRAM N/A 10/26/2011   Procedure: FISTULOGRAM;   Surgeon: Rosetta Posner, MD;  Location: [facility] CATH LAB;  Service: Cardiovascular;  Laterality: N/A;  . INCISIONAL HERNIA REPAIR  2007    Prior to Admission medications   Medication Sig Start Date End Date Taking? Authorizing Provider  calcium acetate (PHOSLO) 667 MG capsule Take 2,001 mg by mouth 3 (three) times daily with meals. May also take 2001mg  with each snack    [provider]  cinacalcet (SENSIPAR) 90 MG tablet Take 90 mg by mouth daily.    [provider]  glipiZIDE (GLUCOTROL XL) 2.5 MG 24 hr tablet Take 2.5 mg by mouth daily.    [provider]  ibuprofen (ADVIL,MOTRIN) 800 MG tablet Take 800 mg by mouth every 8 (eight) hours as needed for mild pain.     [provider]  levothyroxine (SYNTHROID, LEVOTHROID) 50 MCG tablet Take 50 mcg by mouth daily. 08/04/16   [provider]  meloxicam (MOBIC) 15 MG tablet Take 1 tablet (15 mg total) by mouth daily. Patient not taking: Reported on 10/08/2016 05/22/16   Victorino Dike, FNP    Allergies Latex  History reviewed. No pertinent family history.  Social History Social History   Tobacco Use  . Smoking status: Former Research scientist (life sciences)  .  Smokeless tobacco: Never Used  Substance Use Topics  . Alcohol use: No  . Drug use: No    Comment: hx crack-last 1999    Review of Systems  Constitutional: No fever/chills Eyes: No visual changes. ENT: No sore throat. Cardiovascular: Denies chest pain. Respiratory: shortness of breath. Gastrointestinal: No abdominal pain.  No nausea, no vomiting.  No diarrhea.  No constipation. Genitourinary: Negative for dysuria. Musculoskeletal: Negative for back pain. Skin: Negative for rash. Neurological: Negative for headaches, focal weakness   ____________________________________________   PHYSICAL EXAM:  VITAL SIGNS: ED Triage Vitals  Enc Vitals Group     BP 07/21/19 1009 (!) 220/122     Pulse Rate 07/21/19 1009 (!) 103     Resp 07/21/19 1009 (!) 24      Temp --      Temp Source 07/21/19 1009 Oral     SpO2 07/21/19 1009 94 %     Weight 07/21/19 1010 190 lb (86.2 kg)     Height 07/21/19 1010 5\' 1"  (1.549 m)     Head Circumference --      Peak Flow --      Pain Score 07/21/19 1010 0     Pain Loc --      Pain Edu? --      Excl. in [place]? --     Constitutional: Alert and oriented. Well appearing and in no acute distress. Eyes: Conjunctivae are normal Head: Atraumatic. Nose: No congestion/rhinnorhea. Mouth/Throat: Mucous membranes are moist.  Oropharynx non-erythematous. Neck: No stridor.   Cardiovascular: Normal rate, regular rhythm. Grossly normal heart sounds.  Good peripheral circulation. Respiratory: Normal respiratory effort.  No retractions. Lungs CTAB. Gastrointestinal: Soft and nontender. No distention. No abdominal bruits.  Musculoskeletal: No lower extremity tenderness nor edema.   Neurologic:  Normal speech and language. No gross focal neurologic deficits are appreciated. No gait instability. Skin:  Skin is warm, dry and intact.    ____________________________________________   LABS (all labs ordered are listed, but only abnormal results are displayed)  Labs Reviewed  BASIC METABOLIC PANEL - Abnormal; Notable for the following components:      Result Value   Glucose, Bld 113 (*)    BUN 28 (*)    Creatinine, Ser 12.12 (*)    GFR calc non Af Amer 3 (*)    GFR calc Af Amer 3 (*)    Anion gap 16 (*)    All other components within normal limits  CBC - Abnormal; Notable for the following components:   RBC 3.83 (*)    Hemoglobin 11.1 (*)    HCT 33.6 (*)    All other components within normal limits  BRAIN NATRIURETIC PEPTIDE - Abnormal; Notable for the following components:   B Natriuretic Peptide 984.0 (*)    All other components within normal limits  TROPONIN I (HIGH SENSITIVITY) - Abnormal; Notable for the following components:   Troponin I (High Sensitivity) 58 (*)    All other components within normal limits   TROPONIN I (HIGH SENSITIVITY) - Abnormal; Notable for the following components:   Troponin I (High Sensitivity) 53 (*)    All other components within normal limits  POC SARS CORONAVIRUS 2 AG -  ED  POC SARS CORONAVIRUS 2 AG   ____________________________________________  EKG   ____________________________________________  RADIOLOGY  ED MD interpretation:   Official radiology report(s): Dg Chest 2 View  Result Date: 07/21/2019 CLINICAL DATA:  Increasing shortness of breath. EXAM: CHEST - 2 VIEW COMPARISON:  Chest  x-ray of 03/10/2014 FINDINGS: Cardiomediastinal contours are enlarged even accounting for differences in technique and portable evaluation. Increased interstitial markings are seen bilaterally. Dense basilar opacification and blunting of right and left costophrenic angle. On the lateral view there is suggestion of lower lobe consolidation. No acute bone finding. IMPRESSION: Findings of may represent heart failure or volume overload. Basilar consolidation suspicious for concomitant pneumonia, atypical infection is included in the differential. Electronically Signed   By: Zetta Bills M.D.   On: 07/21/2019 12:12    ____________________________________________   PROCEDURES  Procedure(s) performed (including Critical Care):  Procedures   ____________________________________________   INITIAL IMPRESSION / ASSESSMENT AND PLAN / ED COURSE  Patient's blood pressure is gone back up.  She is short of breath when she walks but okay if she sits.  She does have congestive failure but probably that in the high blood pressure due to her missing dialysis yesterday.  Her troponin is stable.  Potassium is okay as well.  I discussed with Dr. Lenon Curt we will get her downstairs dialyzed and I expect if she is better she should be on to go home.    Gerardo A Ascher was evaluated in Emergency Department on 07/21/2019 for the symptoms described in the history of present illness. She was  evaluated in the context of the global COVID-19 pandemic, which necessitated consideration that the patient might be at risk for infection with the SARS-CoV-2 virus that causes COVID-19. Institutional protocols and algorithms that pertain to the evaluation of patients at risk for COVID-19 are in a state of rapid change based on information released by regulatory bodies including the CDC and federal and state organizations. These policies and algorithms were followed during the patient's care in the ED.   I have ordered troponin BNP chest x-ray and EKG to be done after dialysis.  I anticipate patient will be better but if she is not we will keep her in the hospital.  She understands this.  I believe her symptoms are due to to fluid overload from missing dialysis yesterday.  We will have to make sure her blood pressure is down as well.  If not we can give her one of them medicines that she takes for.  Family is currently getting the correct names which I do not have in the medical record.       ____________________________________________   FINAL CLINICAL IMPRESSION(S) / ED DIAGNOSES  Final diagnoses:  Other hypervolemia     ED Discharge Orders    None       Note:  This document was prepared using Dragon voice recognition software and may include unintentional dictation errors.    Nena Polio, MD 07/21/19 1442    Nena Polio, MD 07/21/19 1535    Nena Polio, MD 07/21/19 1536

## 2019-07-22 LAB — HEPATITIS B SURFACE ANTIGEN: Hepatitis B Surface Ag: NONREACTIVE

## 2019-07-23 LAB — HEPATITIS B SURFACE ANTIBODY, QUANTITATIVE: Hep B S AB Quant (Post): 36.7 m[IU]/mL (ref >9.9)

## 2019-09-17 DIAGNOSIS — D631 Anemia in chronic kidney disease: Secondary | ICD-10-CM | POA: Diagnosis not present

## 2019-09-17 DIAGNOSIS — D689 Coagulation defect, unspecified: Secondary | ICD-10-CM | POA: Diagnosis not present

## 2019-09-17 DIAGNOSIS — N186 End stage renal disease: Secondary | ICD-10-CM | POA: Diagnosis not present

## 2019-09-17 DIAGNOSIS — Z992 Dependence on renal dialysis: Secondary | ICD-10-CM | POA: Diagnosis not present

## 2019-09-17 DIAGNOSIS — N2581 Secondary hyperparathyroidism of renal origin: Secondary | ICD-10-CM | POA: Diagnosis not present

## 2019-09-19 DIAGNOSIS — D631 Anemia in chronic kidney disease: Secondary | ICD-10-CM | POA: Diagnosis not present

## 2019-09-19 DIAGNOSIS — Z992 Dependence on renal dialysis: Secondary | ICD-10-CM | POA: Diagnosis not present

## 2019-09-19 DIAGNOSIS — N2581 Secondary hyperparathyroidism of renal origin: Secondary | ICD-10-CM | POA: Diagnosis not present

## 2019-09-19 DIAGNOSIS — N186 End stage renal disease: Secondary | ICD-10-CM | POA: Diagnosis not present

## 2019-09-19 DIAGNOSIS — D689 Coagulation defect, unspecified: Secondary | ICD-10-CM | POA: Diagnosis not present

## 2019-09-24 DIAGNOSIS — D631 Anemia in chronic kidney disease: Secondary | ICD-10-CM | POA: Diagnosis not present

## 2019-09-24 DIAGNOSIS — D689 Coagulation defect, unspecified: Secondary | ICD-10-CM | POA: Diagnosis not present

## 2019-09-24 DIAGNOSIS — Z992 Dependence on renal dialysis: Secondary | ICD-10-CM | POA: Diagnosis not present

## 2019-09-24 DIAGNOSIS — N2581 Secondary hyperparathyroidism of renal origin: Secondary | ICD-10-CM | POA: Diagnosis not present

## 2019-09-24 DIAGNOSIS — N186 End stage renal disease: Secondary | ICD-10-CM | POA: Diagnosis not present

## 2019-09-26 DIAGNOSIS — N2581 Secondary hyperparathyroidism of renal origin: Secondary | ICD-10-CM | POA: Diagnosis not present

## 2019-09-26 DIAGNOSIS — D631 Anemia in chronic kidney disease: Secondary | ICD-10-CM | POA: Diagnosis not present

## 2019-09-26 DIAGNOSIS — N186 End stage renal disease: Secondary | ICD-10-CM | POA: Diagnosis not present

## 2019-09-26 DIAGNOSIS — Z992 Dependence on renal dialysis: Secondary | ICD-10-CM | POA: Diagnosis not present

## 2019-09-26 DIAGNOSIS — D689 Coagulation defect, unspecified: Secondary | ICD-10-CM | POA: Diagnosis not present

## 2019-10-01 DIAGNOSIS — Z992 Dependence on renal dialysis: Secondary | ICD-10-CM | POA: Diagnosis not present

## 2019-10-01 DIAGNOSIS — D689 Coagulation defect, unspecified: Secondary | ICD-10-CM | POA: Diagnosis not present

## 2019-10-01 DIAGNOSIS — D631 Anemia in chronic kidney disease: Secondary | ICD-10-CM | POA: Diagnosis not present

## 2019-10-01 DIAGNOSIS — N186 End stage renal disease: Secondary | ICD-10-CM | POA: Diagnosis not present

## 2019-10-01 DIAGNOSIS — N2581 Secondary hyperparathyroidism of renal origin: Secondary | ICD-10-CM | POA: Diagnosis not present

## 2019-10-08 DIAGNOSIS — D689 Coagulation defect, unspecified: Secondary | ICD-10-CM | POA: Diagnosis not present

## 2019-10-08 DIAGNOSIS — D631 Anemia in chronic kidney disease: Secondary | ICD-10-CM | POA: Diagnosis not present

## 2019-10-08 DIAGNOSIS — N2581 Secondary hyperparathyroidism of renal origin: Secondary | ICD-10-CM | POA: Diagnosis not present

## 2019-10-08 DIAGNOSIS — Z992 Dependence on renal dialysis: Secondary | ICD-10-CM | POA: Diagnosis not present

## 2019-10-08 DIAGNOSIS — N186 End stage renal disease: Secondary | ICD-10-CM | POA: Diagnosis not present

## 2019-10-10 ENCOUNTER — Ambulatory Visit: Payer: Self-pay | Admitting: Adult Health

## 2019-10-10 DIAGNOSIS — D689 Coagulation defect, unspecified: Secondary | ICD-10-CM | POA: Diagnosis not present

## 2019-10-10 DIAGNOSIS — Z992 Dependence on renal dialysis: Secondary | ICD-10-CM | POA: Diagnosis not present

## 2019-10-10 DIAGNOSIS — D631 Anemia in chronic kidney disease: Secondary | ICD-10-CM | POA: Diagnosis not present

## 2019-10-10 DIAGNOSIS — N186 End stage renal disease: Secondary | ICD-10-CM | POA: Diagnosis not present

## 2019-10-10 DIAGNOSIS — N2581 Secondary hyperparathyroidism of renal origin: Secondary | ICD-10-CM | POA: Diagnosis not present

## 2019-10-13 DIAGNOSIS — Z992 Dependence on renal dialysis: Secondary | ICD-10-CM | POA: Diagnosis not present

## 2019-10-13 DIAGNOSIS — I120 Hypertensive chronic kidney disease with stage 5 chronic kidney disease or end stage renal disease: Secondary | ICD-10-CM | POA: Diagnosis not present

## 2019-10-13 DIAGNOSIS — N186 End stage renal disease: Secondary | ICD-10-CM | POA: Diagnosis not present

## 2019-10-15 DIAGNOSIS — E1122 Type 2 diabetes mellitus with diabetic chronic kidney disease: Secondary | ICD-10-CM | POA: Diagnosis not present

## 2019-10-15 DIAGNOSIS — D509 Iron deficiency anemia, unspecified: Secondary | ICD-10-CM | POA: Diagnosis not present

## 2019-10-15 DIAGNOSIS — N2581 Secondary hyperparathyroidism of renal origin: Secondary | ICD-10-CM | POA: Diagnosis not present

## 2019-10-15 DIAGNOSIS — D689 Coagulation defect, unspecified: Secondary | ICD-10-CM | POA: Diagnosis not present

## 2019-10-15 DIAGNOSIS — N186 End stage renal disease: Secondary | ICD-10-CM | POA: Diagnosis not present

## 2019-10-15 DIAGNOSIS — E0590 Thyrotoxicosis, unspecified without thyrotoxic crisis or storm: Secondary | ICD-10-CM | POA: Diagnosis not present

## 2019-10-15 DIAGNOSIS — Z992 Dependence on renal dialysis: Secondary | ICD-10-CM | POA: Diagnosis not present

## 2019-10-15 DIAGNOSIS — D631 Anemia in chronic kidney disease: Secondary | ICD-10-CM | POA: Diagnosis not present

## 2019-10-17 DIAGNOSIS — Z992 Dependence on renal dialysis: Secondary | ICD-10-CM | POA: Diagnosis not present

## 2019-10-17 DIAGNOSIS — E0590 Thyrotoxicosis, unspecified without thyrotoxic crisis or storm: Secondary | ICD-10-CM | POA: Diagnosis not present

## 2019-10-17 DIAGNOSIS — E1122 Type 2 diabetes mellitus with diabetic chronic kidney disease: Secondary | ICD-10-CM | POA: Diagnosis not present

## 2019-10-17 DIAGNOSIS — N186 End stage renal disease: Secondary | ICD-10-CM | POA: Diagnosis not present

## 2019-10-17 DIAGNOSIS — D631 Anemia in chronic kidney disease: Secondary | ICD-10-CM | POA: Diagnosis not present

## 2019-10-17 DIAGNOSIS — N2581 Secondary hyperparathyroidism of renal origin: Secondary | ICD-10-CM | POA: Diagnosis not present

## 2019-10-17 DIAGNOSIS — D689 Coagulation defect, unspecified: Secondary | ICD-10-CM | POA: Diagnosis not present

## 2019-10-17 DIAGNOSIS — D509 Iron deficiency anemia, unspecified: Secondary | ICD-10-CM | POA: Diagnosis not present

## 2019-10-22 DIAGNOSIS — N2581 Secondary hyperparathyroidism of renal origin: Secondary | ICD-10-CM | POA: Diagnosis not present

## 2019-10-22 DIAGNOSIS — E1122 Type 2 diabetes mellitus with diabetic chronic kidney disease: Secondary | ICD-10-CM | POA: Diagnosis not present

## 2019-10-22 DIAGNOSIS — D509 Iron deficiency anemia, unspecified: Secondary | ICD-10-CM | POA: Diagnosis not present

## 2019-10-22 DIAGNOSIS — D689 Coagulation defect, unspecified: Secondary | ICD-10-CM | POA: Diagnosis not present

## 2019-10-22 DIAGNOSIS — D631 Anemia in chronic kidney disease: Secondary | ICD-10-CM | POA: Diagnosis not present

## 2019-10-22 DIAGNOSIS — N186 End stage renal disease: Secondary | ICD-10-CM | POA: Diagnosis not present

## 2019-10-22 DIAGNOSIS — E0590 Thyrotoxicosis, unspecified without thyrotoxic crisis or storm: Secondary | ICD-10-CM | POA: Diagnosis not present

## 2019-10-22 DIAGNOSIS — Z992 Dependence on renal dialysis: Secondary | ICD-10-CM | POA: Diagnosis not present

## 2019-10-24 DIAGNOSIS — E0590 Thyrotoxicosis, unspecified without thyrotoxic crisis or storm: Secondary | ICD-10-CM | POA: Diagnosis not present

## 2019-10-24 DIAGNOSIS — D689 Coagulation defect, unspecified: Secondary | ICD-10-CM | POA: Diagnosis not present

## 2019-10-24 DIAGNOSIS — D631 Anemia in chronic kidney disease: Secondary | ICD-10-CM | POA: Diagnosis not present

## 2019-10-24 DIAGNOSIS — D509 Iron deficiency anemia, unspecified: Secondary | ICD-10-CM | POA: Diagnosis not present

## 2019-10-24 DIAGNOSIS — Z992 Dependence on renal dialysis: Secondary | ICD-10-CM | POA: Diagnosis not present

## 2019-10-24 DIAGNOSIS — E1122 Type 2 diabetes mellitus with diabetic chronic kidney disease: Secondary | ICD-10-CM | POA: Diagnosis not present

## 2019-10-24 DIAGNOSIS — N186 End stage renal disease: Secondary | ICD-10-CM | POA: Diagnosis not present

## 2019-10-24 DIAGNOSIS — N2581 Secondary hyperparathyroidism of renal origin: Secondary | ICD-10-CM | POA: Diagnosis not present

## 2019-10-29 DIAGNOSIS — D689 Coagulation defect, unspecified: Secondary | ICD-10-CM | POA: Diagnosis not present

## 2019-10-29 DIAGNOSIS — E0590 Thyrotoxicosis, unspecified without thyrotoxic crisis or storm: Secondary | ICD-10-CM | POA: Diagnosis not present

## 2019-10-29 DIAGNOSIS — D631 Anemia in chronic kidney disease: Secondary | ICD-10-CM | POA: Diagnosis not present

## 2019-10-29 DIAGNOSIS — N186 End stage renal disease: Secondary | ICD-10-CM | POA: Diagnosis not present

## 2019-10-29 DIAGNOSIS — D509 Iron deficiency anemia, unspecified: Secondary | ICD-10-CM | POA: Diagnosis not present

## 2019-10-29 DIAGNOSIS — Z992 Dependence on renal dialysis: Secondary | ICD-10-CM | POA: Diagnosis not present

## 2019-10-29 DIAGNOSIS — E1122 Type 2 diabetes mellitus with diabetic chronic kidney disease: Secondary | ICD-10-CM | POA: Diagnosis not present

## 2019-10-29 DIAGNOSIS — N2581 Secondary hyperparathyroidism of renal origin: Secondary | ICD-10-CM | POA: Diagnosis not present

## 2019-10-31 DIAGNOSIS — N2581 Secondary hyperparathyroidism of renal origin: Secondary | ICD-10-CM | POA: Diagnosis not present

## 2019-10-31 DIAGNOSIS — D689 Coagulation defect, unspecified: Secondary | ICD-10-CM | POA: Diagnosis not present

## 2019-10-31 DIAGNOSIS — E0590 Thyrotoxicosis, unspecified without thyrotoxic crisis or storm: Secondary | ICD-10-CM | POA: Diagnosis not present

## 2019-10-31 DIAGNOSIS — D631 Anemia in chronic kidney disease: Secondary | ICD-10-CM | POA: Diagnosis not present

## 2019-10-31 DIAGNOSIS — N186 End stage renal disease: Secondary | ICD-10-CM | POA: Diagnosis not present

## 2019-10-31 DIAGNOSIS — D509 Iron deficiency anemia, unspecified: Secondary | ICD-10-CM | POA: Diagnosis not present

## 2019-10-31 DIAGNOSIS — Z992 Dependence on renal dialysis: Secondary | ICD-10-CM | POA: Diagnosis not present

## 2019-10-31 DIAGNOSIS — E1122 Type 2 diabetes mellitus with diabetic chronic kidney disease: Secondary | ICD-10-CM | POA: Diagnosis not present

## 2019-11-05 DIAGNOSIS — D509 Iron deficiency anemia, unspecified: Secondary | ICD-10-CM | POA: Diagnosis not present

## 2019-11-05 DIAGNOSIS — N2581 Secondary hyperparathyroidism of renal origin: Secondary | ICD-10-CM | POA: Diagnosis not present

## 2019-11-05 DIAGNOSIS — E0590 Thyrotoxicosis, unspecified without thyrotoxic crisis or storm: Secondary | ICD-10-CM | POA: Diagnosis not present

## 2019-11-05 DIAGNOSIS — D631 Anemia in chronic kidney disease: Secondary | ICD-10-CM | POA: Diagnosis not present

## 2019-11-05 DIAGNOSIS — Z992 Dependence on renal dialysis: Secondary | ICD-10-CM | POA: Diagnosis not present

## 2019-11-05 DIAGNOSIS — D689 Coagulation defect, unspecified: Secondary | ICD-10-CM | POA: Diagnosis not present

## 2019-11-05 DIAGNOSIS — E1122 Type 2 diabetes mellitus with diabetic chronic kidney disease: Secondary | ICD-10-CM | POA: Diagnosis not present

## 2019-11-05 DIAGNOSIS — N186 End stage renal disease: Secondary | ICD-10-CM | POA: Diagnosis not present

## 2019-11-07 DIAGNOSIS — E0590 Thyrotoxicosis, unspecified without thyrotoxic crisis or storm: Secondary | ICD-10-CM | POA: Diagnosis not present

## 2019-11-07 DIAGNOSIS — D631 Anemia in chronic kidney disease: Secondary | ICD-10-CM | POA: Diagnosis not present

## 2019-11-07 DIAGNOSIS — N2581 Secondary hyperparathyroidism of renal origin: Secondary | ICD-10-CM | POA: Diagnosis not present

## 2019-11-07 DIAGNOSIS — E1122 Type 2 diabetes mellitus with diabetic chronic kidney disease: Secondary | ICD-10-CM | POA: Diagnosis not present

## 2019-11-07 DIAGNOSIS — D689 Coagulation defect, unspecified: Secondary | ICD-10-CM | POA: Diagnosis not present

## 2019-11-07 DIAGNOSIS — Z992 Dependence on renal dialysis: Secondary | ICD-10-CM | POA: Diagnosis not present

## 2019-11-07 DIAGNOSIS — N186 End stage renal disease: Secondary | ICD-10-CM | POA: Diagnosis not present

## 2019-11-07 DIAGNOSIS — D509 Iron deficiency anemia, unspecified: Secondary | ICD-10-CM | POA: Diagnosis not present

## 2019-11-12 DIAGNOSIS — D631 Anemia in chronic kidney disease: Secondary | ICD-10-CM | POA: Diagnosis not present

## 2019-11-12 DIAGNOSIS — D509 Iron deficiency anemia, unspecified: Secondary | ICD-10-CM | POA: Diagnosis not present

## 2019-11-12 DIAGNOSIS — Z992 Dependence on renal dialysis: Secondary | ICD-10-CM | POA: Diagnosis not present

## 2019-11-12 DIAGNOSIS — E0590 Thyrotoxicosis, unspecified without thyrotoxic crisis or storm: Secondary | ICD-10-CM | POA: Diagnosis not present

## 2019-11-12 DIAGNOSIS — N2581 Secondary hyperparathyroidism of renal origin: Secondary | ICD-10-CM | POA: Diagnosis not present

## 2019-11-12 DIAGNOSIS — E1122 Type 2 diabetes mellitus with diabetic chronic kidney disease: Secondary | ICD-10-CM | POA: Diagnosis not present

## 2019-11-12 DIAGNOSIS — D689 Coagulation defect, unspecified: Secondary | ICD-10-CM | POA: Diagnosis not present

## 2019-11-12 DIAGNOSIS — N186 End stage renal disease: Secondary | ICD-10-CM | POA: Diagnosis not present

## 2019-11-13 DIAGNOSIS — N186 End stage renal disease: Secondary | ICD-10-CM | POA: Diagnosis not present

## 2019-11-13 DIAGNOSIS — I120 Hypertensive chronic kidney disease with stage 5 chronic kidney disease or end stage renal disease: Secondary | ICD-10-CM | POA: Diagnosis not present

## 2019-11-13 DIAGNOSIS — Z992 Dependence on renal dialysis: Secondary | ICD-10-CM | POA: Diagnosis not present

## 2019-11-14 DIAGNOSIS — N2581 Secondary hyperparathyroidism of renal origin: Secondary | ICD-10-CM | POA: Diagnosis not present

## 2019-11-14 DIAGNOSIS — N186 End stage renal disease: Secondary | ICD-10-CM | POA: Diagnosis not present

## 2019-11-14 DIAGNOSIS — D689 Coagulation defect, unspecified: Secondary | ICD-10-CM | POA: Diagnosis not present

## 2019-11-14 DIAGNOSIS — D631 Anemia in chronic kidney disease: Secondary | ICD-10-CM | POA: Diagnosis not present

## 2019-11-14 DIAGNOSIS — Z992 Dependence on renal dialysis: Secondary | ICD-10-CM | POA: Diagnosis not present

## 2019-11-19 DIAGNOSIS — Z992 Dependence on renal dialysis: Secondary | ICD-10-CM | POA: Diagnosis not present

## 2019-11-19 DIAGNOSIS — D689 Coagulation defect, unspecified: Secondary | ICD-10-CM | POA: Diagnosis not present

## 2019-11-19 DIAGNOSIS — N2581 Secondary hyperparathyroidism of renal origin: Secondary | ICD-10-CM | POA: Diagnosis not present

## 2019-11-19 DIAGNOSIS — D631 Anemia in chronic kidney disease: Secondary | ICD-10-CM | POA: Diagnosis not present

## 2019-11-19 DIAGNOSIS — N186 End stage renal disease: Secondary | ICD-10-CM | POA: Diagnosis not present

## 2019-11-21 DIAGNOSIS — Z992 Dependence on renal dialysis: Secondary | ICD-10-CM | POA: Diagnosis not present

## 2019-11-21 DIAGNOSIS — D631 Anemia in chronic kidney disease: Secondary | ICD-10-CM | POA: Diagnosis not present

## 2019-11-21 DIAGNOSIS — D689 Coagulation defect, unspecified: Secondary | ICD-10-CM | POA: Diagnosis not present

## 2019-11-21 DIAGNOSIS — N186 End stage renal disease: Secondary | ICD-10-CM | POA: Diagnosis not present

## 2019-11-21 DIAGNOSIS — N2581 Secondary hyperparathyroidism of renal origin: Secondary | ICD-10-CM | POA: Diagnosis not present

## 2019-11-26 DIAGNOSIS — D689 Coagulation defect, unspecified: Secondary | ICD-10-CM | POA: Diagnosis not present

## 2019-11-26 DIAGNOSIS — N186 End stage renal disease: Secondary | ICD-10-CM | POA: Diagnosis not present

## 2019-11-26 DIAGNOSIS — Z992 Dependence on renal dialysis: Secondary | ICD-10-CM | POA: Diagnosis not present

## 2019-11-26 DIAGNOSIS — D631 Anemia in chronic kidney disease: Secondary | ICD-10-CM | POA: Diagnosis not present

## 2019-11-26 DIAGNOSIS — N2581 Secondary hyperparathyroidism of renal origin: Secondary | ICD-10-CM | POA: Diagnosis not present

## 2019-11-28 DIAGNOSIS — N2581 Secondary hyperparathyroidism of renal origin: Secondary | ICD-10-CM | POA: Diagnosis not present

## 2019-11-28 DIAGNOSIS — N186 End stage renal disease: Secondary | ICD-10-CM | POA: Diagnosis not present

## 2019-11-28 DIAGNOSIS — D689 Coagulation defect, unspecified: Secondary | ICD-10-CM | POA: Diagnosis not present

## 2019-11-28 DIAGNOSIS — Z992 Dependence on renal dialysis: Secondary | ICD-10-CM | POA: Diagnosis not present

## 2019-11-28 DIAGNOSIS — D631 Anemia in chronic kidney disease: Secondary | ICD-10-CM | POA: Diagnosis not present

## 2019-12-03 DIAGNOSIS — N2581 Secondary hyperparathyroidism of renal origin: Secondary | ICD-10-CM | POA: Diagnosis not present

## 2019-12-03 DIAGNOSIS — N186 End stage renal disease: Secondary | ICD-10-CM | POA: Diagnosis not present

## 2019-12-03 DIAGNOSIS — D631 Anemia in chronic kidney disease: Secondary | ICD-10-CM | POA: Diagnosis not present

## 2019-12-03 DIAGNOSIS — Z992 Dependence on renal dialysis: Secondary | ICD-10-CM | POA: Diagnosis not present

## 2019-12-03 DIAGNOSIS — D689 Coagulation defect, unspecified: Secondary | ICD-10-CM | POA: Diagnosis not present

## 2019-12-05 DIAGNOSIS — N186 End stage renal disease: Secondary | ICD-10-CM | POA: Diagnosis not present

## 2019-12-05 DIAGNOSIS — D631 Anemia in chronic kidney disease: Secondary | ICD-10-CM | POA: Diagnosis not present

## 2019-12-05 DIAGNOSIS — Z992 Dependence on renal dialysis: Secondary | ICD-10-CM | POA: Diagnosis not present

## 2019-12-05 DIAGNOSIS — D689 Coagulation defect, unspecified: Secondary | ICD-10-CM | POA: Diagnosis not present

## 2019-12-05 DIAGNOSIS — N2581 Secondary hyperparathyroidism of renal origin: Secondary | ICD-10-CM | POA: Diagnosis not present

## 2019-12-06 DIAGNOSIS — Z992 Dependence on renal dialysis: Secondary | ICD-10-CM | POA: Diagnosis not present

## 2019-12-06 DIAGNOSIS — T82858A Stenosis of vascular prosthetic devices, implants and grafts, initial encounter: Secondary | ICD-10-CM | POA: Diagnosis not present

## 2019-12-06 DIAGNOSIS — N186 End stage renal disease: Secondary | ICD-10-CM | POA: Diagnosis not present

## 2019-12-06 DIAGNOSIS — I871 Compression of vein: Secondary | ICD-10-CM | POA: Diagnosis not present

## 2019-12-07 DIAGNOSIS — N2581 Secondary hyperparathyroidism of renal origin: Secondary | ICD-10-CM | POA: Diagnosis not present

## 2019-12-07 DIAGNOSIS — Z992 Dependence on renal dialysis: Secondary | ICD-10-CM | POA: Diagnosis not present

## 2019-12-07 DIAGNOSIS — N186 End stage renal disease: Secondary | ICD-10-CM | POA: Diagnosis not present

## 2019-12-07 DIAGNOSIS — D631 Anemia in chronic kidney disease: Secondary | ICD-10-CM | POA: Diagnosis not present

## 2019-12-07 DIAGNOSIS — D689 Coagulation defect, unspecified: Secondary | ICD-10-CM | POA: Diagnosis not present

## 2019-12-10 DIAGNOSIS — D689 Coagulation defect, unspecified: Secondary | ICD-10-CM | POA: Diagnosis not present

## 2019-12-10 DIAGNOSIS — Z992 Dependence on renal dialysis: Secondary | ICD-10-CM | POA: Diagnosis not present

## 2019-12-10 DIAGNOSIS — D631 Anemia in chronic kidney disease: Secondary | ICD-10-CM | POA: Diagnosis not present

## 2019-12-10 DIAGNOSIS — N2581 Secondary hyperparathyroidism of renal origin: Secondary | ICD-10-CM | POA: Diagnosis not present

## 2019-12-10 DIAGNOSIS — N186 End stage renal disease: Secondary | ICD-10-CM | POA: Diagnosis not present

## 2019-12-12 DIAGNOSIS — N2581 Secondary hyperparathyroidism of renal origin: Secondary | ICD-10-CM | POA: Diagnosis not present

## 2019-12-12 DIAGNOSIS — N186 End stage renal disease: Secondary | ICD-10-CM | POA: Diagnosis not present

## 2019-12-12 DIAGNOSIS — D689 Coagulation defect, unspecified: Secondary | ICD-10-CM | POA: Diagnosis not present

## 2019-12-12 DIAGNOSIS — Z992 Dependence on renal dialysis: Secondary | ICD-10-CM | POA: Diagnosis not present

## 2019-12-12 DIAGNOSIS — D631 Anemia in chronic kidney disease: Secondary | ICD-10-CM | POA: Diagnosis not present

## 2019-12-13 DIAGNOSIS — N186 End stage renal disease: Secondary | ICD-10-CM | POA: Diagnosis not present

## 2019-12-13 DIAGNOSIS — I120 Hypertensive chronic kidney disease with stage 5 chronic kidney disease or end stage renal disease: Secondary | ICD-10-CM | POA: Diagnosis not present

## 2019-12-13 DIAGNOSIS — Z992 Dependence on renal dialysis: Secondary | ICD-10-CM | POA: Diagnosis not present

## 2019-12-17 DIAGNOSIS — Z992 Dependence on renal dialysis: Secondary | ICD-10-CM | POA: Diagnosis not present

## 2019-12-17 DIAGNOSIS — D689 Coagulation defect, unspecified: Secondary | ICD-10-CM | POA: Diagnosis not present

## 2019-12-17 DIAGNOSIS — D631 Anemia in chronic kidney disease: Secondary | ICD-10-CM | POA: Diagnosis not present

## 2019-12-17 DIAGNOSIS — L299 Pruritus, unspecified: Secondary | ICD-10-CM | POA: Diagnosis not present

## 2019-12-17 DIAGNOSIS — R52 Pain, unspecified: Secondary | ICD-10-CM | POA: Diagnosis not present

## 2019-12-17 DIAGNOSIS — N2581 Secondary hyperparathyroidism of renal origin: Secondary | ICD-10-CM | POA: Diagnosis not present

## 2019-12-17 DIAGNOSIS — N186 End stage renal disease: Secondary | ICD-10-CM | POA: Diagnosis not present

## 2019-12-19 DIAGNOSIS — N2581 Secondary hyperparathyroidism of renal origin: Secondary | ICD-10-CM | POA: Diagnosis not present

## 2019-12-19 DIAGNOSIS — D689 Coagulation defect, unspecified: Secondary | ICD-10-CM | POA: Diagnosis not present

## 2019-12-19 DIAGNOSIS — Z992 Dependence on renal dialysis: Secondary | ICD-10-CM | POA: Diagnosis not present

## 2019-12-19 DIAGNOSIS — N186 End stage renal disease: Secondary | ICD-10-CM | POA: Diagnosis not present

## 2019-12-19 DIAGNOSIS — D631 Anemia in chronic kidney disease: Secondary | ICD-10-CM | POA: Diagnosis not present

## 2019-12-19 DIAGNOSIS — R52 Pain, unspecified: Secondary | ICD-10-CM | POA: Diagnosis not present

## 2019-12-19 DIAGNOSIS — L299 Pruritus, unspecified: Secondary | ICD-10-CM | POA: Diagnosis not present

## 2019-12-19 LAB — HEPATITIS B SURFACE ANTIGEN: Hepatitis B Surface Ag: NEGATIVE

## 2019-12-24 DIAGNOSIS — Z992 Dependence on renal dialysis: Secondary | ICD-10-CM | POA: Diagnosis not present

## 2019-12-24 DIAGNOSIS — N2581 Secondary hyperparathyroidism of renal origin: Secondary | ICD-10-CM | POA: Diagnosis not present

## 2019-12-24 DIAGNOSIS — L299 Pruritus, unspecified: Secondary | ICD-10-CM | POA: Diagnosis not present

## 2019-12-24 DIAGNOSIS — D689 Coagulation defect, unspecified: Secondary | ICD-10-CM | POA: Diagnosis not present

## 2019-12-24 DIAGNOSIS — D631 Anemia in chronic kidney disease: Secondary | ICD-10-CM | POA: Diagnosis not present

## 2019-12-24 DIAGNOSIS — R52 Pain, unspecified: Secondary | ICD-10-CM | POA: Diagnosis not present

## 2019-12-24 DIAGNOSIS — N186 End stage renal disease: Secondary | ICD-10-CM | POA: Diagnosis not present

## 2019-12-26 DIAGNOSIS — R52 Pain, unspecified: Secondary | ICD-10-CM | POA: Diagnosis not present

## 2019-12-26 DIAGNOSIS — N186 End stage renal disease: Secondary | ICD-10-CM | POA: Diagnosis not present

## 2019-12-26 DIAGNOSIS — D631 Anemia in chronic kidney disease: Secondary | ICD-10-CM | POA: Diagnosis not present

## 2019-12-26 DIAGNOSIS — N2581 Secondary hyperparathyroidism of renal origin: Secondary | ICD-10-CM | POA: Diagnosis not present

## 2019-12-26 DIAGNOSIS — D689 Coagulation defect, unspecified: Secondary | ICD-10-CM | POA: Diagnosis not present

## 2019-12-26 DIAGNOSIS — Z992 Dependence on renal dialysis: Secondary | ICD-10-CM | POA: Diagnosis not present

## 2019-12-26 DIAGNOSIS — L299 Pruritus, unspecified: Secondary | ICD-10-CM | POA: Diagnosis not present

## 2019-12-31 DIAGNOSIS — L299 Pruritus, unspecified: Secondary | ICD-10-CM | POA: Diagnosis not present

## 2019-12-31 DIAGNOSIS — D631 Anemia in chronic kidney disease: Secondary | ICD-10-CM | POA: Diagnosis not present

## 2019-12-31 DIAGNOSIS — N2581 Secondary hyperparathyroidism of renal origin: Secondary | ICD-10-CM | POA: Diagnosis not present

## 2019-12-31 DIAGNOSIS — R52 Pain, unspecified: Secondary | ICD-10-CM | POA: Diagnosis not present

## 2019-12-31 DIAGNOSIS — N186 End stage renal disease: Secondary | ICD-10-CM | POA: Diagnosis not present

## 2019-12-31 DIAGNOSIS — D689 Coagulation defect, unspecified: Secondary | ICD-10-CM | POA: Diagnosis not present

## 2019-12-31 DIAGNOSIS — Z992 Dependence on renal dialysis: Secondary | ICD-10-CM | POA: Diagnosis not present

## 2020-01-01 ENCOUNTER — Other Ambulatory Visit: Payer: Self-pay

## 2020-01-02 DIAGNOSIS — N186 End stage renal disease: Secondary | ICD-10-CM | POA: Diagnosis not present

## 2020-01-02 DIAGNOSIS — D689 Coagulation defect, unspecified: Secondary | ICD-10-CM | POA: Diagnosis not present

## 2020-01-02 DIAGNOSIS — L299 Pruritus, unspecified: Secondary | ICD-10-CM | POA: Diagnosis not present

## 2020-01-02 DIAGNOSIS — Z992 Dependence on renal dialysis: Secondary | ICD-10-CM | POA: Diagnosis not present

## 2020-01-02 DIAGNOSIS — N2581 Secondary hyperparathyroidism of renal origin: Secondary | ICD-10-CM | POA: Diagnosis not present

## 2020-01-02 DIAGNOSIS — R52 Pain, unspecified: Secondary | ICD-10-CM | POA: Diagnosis not present

## 2020-01-02 DIAGNOSIS — D631 Anemia in chronic kidney disease: Secondary | ICD-10-CM | POA: Diagnosis not present

## 2020-01-02 LAB — COMPREHENSIVE METABOLIC PANEL
Albumin: 3.6 (ref 3.5–5.0)
Calcium: 10.4 (ref 8.7–10.7)

## 2020-01-02 LAB — CBC AND DIFFERENTIAL
Platelets: 167 (ref 150–399)
WBC: 3.3

## 2020-01-02 LAB — BASIC METABOLIC PANEL
Creatinine: 11.1 — AB (ref <=1.1)
Potassium: 3.8 (ref 3.4–5.3)
Sodium: 140 (ref 137–147)

## 2020-01-07 DIAGNOSIS — R52 Pain, unspecified: Secondary | ICD-10-CM | POA: Diagnosis not present

## 2020-01-07 DIAGNOSIS — D689 Coagulation defect, unspecified: Secondary | ICD-10-CM | POA: Diagnosis not present

## 2020-01-07 DIAGNOSIS — N186 End stage renal disease: Secondary | ICD-10-CM | POA: Diagnosis not present

## 2020-01-07 DIAGNOSIS — N2581 Secondary hyperparathyroidism of renal origin: Secondary | ICD-10-CM | POA: Diagnosis not present

## 2020-01-07 DIAGNOSIS — D631 Anemia in chronic kidney disease: Secondary | ICD-10-CM | POA: Diagnosis not present

## 2020-01-07 DIAGNOSIS — L299 Pruritus, unspecified: Secondary | ICD-10-CM | POA: Diagnosis not present

## 2020-01-07 DIAGNOSIS — Z992 Dependence on renal dialysis: Secondary | ICD-10-CM | POA: Diagnosis not present

## 2020-01-09 DIAGNOSIS — L299 Pruritus, unspecified: Secondary | ICD-10-CM | POA: Diagnosis not present

## 2020-01-09 DIAGNOSIS — R52 Pain, unspecified: Secondary | ICD-10-CM | POA: Diagnosis not present

## 2020-01-09 DIAGNOSIS — D631 Anemia in chronic kidney disease: Secondary | ICD-10-CM | POA: Diagnosis not present

## 2020-01-09 DIAGNOSIS — N186 End stage renal disease: Secondary | ICD-10-CM | POA: Diagnosis not present

## 2020-01-09 DIAGNOSIS — N2581 Secondary hyperparathyroidism of renal origin: Secondary | ICD-10-CM | POA: Diagnosis not present

## 2020-01-09 DIAGNOSIS — D689 Coagulation defect, unspecified: Secondary | ICD-10-CM | POA: Diagnosis not present

## 2020-01-09 DIAGNOSIS — Z992 Dependence on renal dialysis: Secondary | ICD-10-CM | POA: Diagnosis not present

## 2020-01-09 LAB — CBC AND DIFFERENTIAL: Hemoglobin: 9.8 — AB (ref 12.0–16.0)

## 2020-01-13 DIAGNOSIS — N186 End stage renal disease: Secondary | ICD-10-CM | POA: Diagnosis not present

## 2020-01-13 DIAGNOSIS — Z992 Dependence on renal dialysis: Secondary | ICD-10-CM | POA: Diagnosis not present

## 2020-01-13 DIAGNOSIS — I120 Hypertensive chronic kidney disease with stage 5 chronic kidney disease or end stage renal disease: Secondary | ICD-10-CM | POA: Diagnosis not present

## 2020-01-14 DIAGNOSIS — E0590 Thyrotoxicosis, unspecified without thyrotoxic crisis or storm: Secondary | ICD-10-CM | POA: Diagnosis not present

## 2020-01-14 DIAGNOSIS — D689 Coagulation defect, unspecified: Secondary | ICD-10-CM | POA: Diagnosis not present

## 2020-01-14 DIAGNOSIS — Z992 Dependence on renal dialysis: Secondary | ICD-10-CM | POA: Diagnosis not present

## 2020-01-14 DIAGNOSIS — N2581 Secondary hyperparathyroidism of renal origin: Secondary | ICD-10-CM | POA: Diagnosis not present

## 2020-01-14 DIAGNOSIS — N186 End stage renal disease: Secondary | ICD-10-CM | POA: Diagnosis not present

## 2020-01-14 DIAGNOSIS — L299 Pruritus, unspecified: Secondary | ICD-10-CM | POA: Diagnosis not present

## 2020-01-14 DIAGNOSIS — E1122 Type 2 diabetes mellitus with diabetic chronic kidney disease: Secondary | ICD-10-CM | POA: Diagnosis not present

## 2020-01-14 DIAGNOSIS — D631 Anemia in chronic kidney disease: Secondary | ICD-10-CM | POA: Diagnosis not present

## 2020-01-15 ENCOUNTER — Other Ambulatory Visit: Payer: Self-pay

## 2020-01-16 DIAGNOSIS — N186 End stage renal disease: Secondary | ICD-10-CM | POA: Diagnosis not present

## 2020-01-16 DIAGNOSIS — D631 Anemia in chronic kidney disease: Secondary | ICD-10-CM | POA: Diagnosis not present

## 2020-01-16 DIAGNOSIS — N2581 Secondary hyperparathyroidism of renal origin: Secondary | ICD-10-CM | POA: Diagnosis not present

## 2020-01-16 DIAGNOSIS — D689 Coagulation defect, unspecified: Secondary | ICD-10-CM | POA: Diagnosis not present

## 2020-01-16 DIAGNOSIS — E0590 Thyrotoxicosis, unspecified without thyrotoxic crisis or storm: Secondary | ICD-10-CM | POA: Diagnosis not present

## 2020-01-16 DIAGNOSIS — Z992 Dependence on renal dialysis: Secondary | ICD-10-CM | POA: Diagnosis not present

## 2020-01-16 DIAGNOSIS — L299 Pruritus, unspecified: Secondary | ICD-10-CM | POA: Diagnosis not present

## 2020-01-16 DIAGNOSIS — E1122 Type 2 diabetes mellitus with diabetic chronic kidney disease: Secondary | ICD-10-CM | POA: Diagnosis not present

## 2020-01-17 ENCOUNTER — Ambulatory Visit (INDEPENDENT_AMBULATORY_CARE_PROVIDER_SITE_OTHER): Payer: Medicare HMO | Admitting: Nurse Practitioner

## 2020-01-17 ENCOUNTER — Other Ambulatory Visit: Payer: Self-pay

## 2020-01-17 ENCOUNTER — Encounter: Payer: Self-pay | Admitting: Nurse Practitioner

## 2020-01-17 VITALS — BP 130/88 | HR 96 | Temp 97.4°F | Ht 61.0 in | Wt 179.0 lb

## 2020-01-17 DIAGNOSIS — E1122 Type 2 diabetes mellitus with diabetic chronic kidney disease: Secondary | ICD-10-CM | POA: Diagnosis not present

## 2020-01-17 DIAGNOSIS — I129 Hypertensive chronic kidney disease with stage 1 through stage 4 chronic kidney disease, or unspecified chronic kidney disease: Secondary | ICD-10-CM | POA: Diagnosis not present

## 2020-01-17 DIAGNOSIS — Z1231 Encounter for screening mammogram for malignant neoplasm of breast: Secondary | ICD-10-CM

## 2020-01-17 DIAGNOSIS — G5602 Carpal tunnel syndrome, left upper limb: Secondary | ICD-10-CM | POA: Diagnosis not present

## 2020-01-17 DIAGNOSIS — Z992 Dependence on renal dialysis: Secondary | ICD-10-CM

## 2020-01-17 DIAGNOSIS — N186 End stage renal disease: Secondary | ICD-10-CM | POA: Diagnosis not present

## 2020-01-17 DIAGNOSIS — K739 Chronic hepatitis, unspecified: Secondary | ICD-10-CM | POA: Diagnosis not present

## 2020-01-17 NOTE — Patient Instructions (Addendum)
It was nice to meet you today.  I will request records from your dialysis physicians.  I need to know what laboratory studies you had recently.  I can order any additional blood work through your dialysis center.  I have placed a referral in for an eye exam.  This is very important because of your longstanding high blood pressure.you will get a phone call from [facility] to schedule an appointment.  Please set up a appointment with me to obtain a complete physical exam and we can work towards getting your clearance for your requested carpal tunnel surgery.   Please call and schedule your 3D mammogram,  as discussed.   [place]  [place], [place]

## 2020-01-17 NOTE — Progress Notes (Addendum)
New Patient Office Visit  Subjective:  Patient ID: Anna [name], female    DOB: 27-Dec-1955  Age: 64 y.o. MRN: [id number]  CC:  Chief Complaint  Patient presents with  . New Patient (Initial Visit)    establish care    HPI Anna [name] is a 64 year old patient here to establish care with a primary care provider.  She has a PMH of T2DM, malignant hypertension, end-stage renal disease dependent on renal dialysis with AV fistula, history of hepatitis C, hypercalcemia, hyperparathyroidism secondary, morbid obesity, thyrotoxicosis, cardiomyopathy, full dentures. She is sent by her [facility] dialysis center to establish care with a primary care provider.   The patient states she does not like to come to the doctor and reports she has been putting off establishing care with a primary care provider.  Her concerns today: She would like to get carpal tunnel surgery done on her left hand and was told she would need be cleared by her primary care provider before that can be done.   She is followed by nephrology for end-stage renal disease requiring hemodialysis. Dr. Lucky Cowboy has placed her AV dialysis fistulas.  She receives her dialysis at [facility]. [phone].   Malignant HTN: Patient presents on amlodipine 10 mg daily, losartan 50 mg daily BP Readings from Last 3 Encounters:  01/17/20 130/88  07/21/19 (!) 165/78  10/10/16 (!) 152/81   Diabetes mellitus type 2: Patient presents on glipizide 2.5 mg tablet  No results found for: HGBA1C   Cardiomyopathy: Positive heart murmur.  She denies any shortness of breath or chest pain.  Patient states that she gets symptoms of fluid overload before dialysis and then afterwards it is resolved.  This has been her pattern for years.  Most recent chest x-ray in the system dated 07/21/2019 with persistent cardiomegaly with volume overload, persistent bibasilar airspace opacities, right worse than left, findings may represent atelectasis or infiltrate.  Small to  moderate-sized bilateral pleural effusions, right greater than left  Thyrotoxicosis/hypothyroidism: Patient presents on levothyroxine thyroxine 50 mcg daily. No results found for: TSH  History of chronic hepatitis C: No recent imaging studies of the liver in Epic.  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 CTD-left hand: reports she needs surgery  Obesity: BMI 33.82: Patient reports weight loss over the years.  She used to weigh more than 230 pounds.  She is now down to 179.  She is eating a regular diet now and weight has been stable.   Wt Readings from Last 3 Encounters:  01/17/20 179 lb (81.2 kg)  07/21/19 190 lb (86.2 kg)  10/08/16 218 lb 11.1 oz (99.2 kg)    Immunizations: Diet:renal Exercise: No  Colonoscopy: 10/13/2014- no repeat EGD: 10/13/2014- no repeat  Dexa: Pap Smear: none listed in Epic Mammogram: none listed in Epic  Past Medical History:  Diagnosis Date  . Anemia   . AV fistula (HCC)    in place-rt upper arm  . Cardiomyopathy ([place])   . Cardiomyopathy ([place])   . Diabetes mellitus type 2 in obese ([place])   . ESRD (end stage renal disease) ([place])   . Full dentures    lost them-no teeth  . Hepatitis C   . Hypercalcemia   . Hyperparathyroidism, secondary ([place])   . Malignant hypertension   .  Morbid obesity ([place])   . Thyrotoxicosis     Past Surgical History:  Procedure Laterality Date  . A/V FISTULAGRAM N/A 10/10/2016   Procedure: A/V Fistulagram;  Surgeon: Algernon Huxley, MD;  Location: [place] [facility];  Service: Cardiovascular;  Laterality: N/A;  . AV FISTULA PLACEMENT  2013   right upper arm  . AV FISTULA PLACEMENT  2008   rt upper arm  . AV FISTULA PLACEMENT  2008   left  . AV FISTULA REPAIR  2008   left taken out  . CARPAL TUNNEL RELEASE Right 03/10/2014   Procedure: CARPAL TUNNEL  RELEASE;  Surgeon: Tennis Must, MD;  Location: [facility];  Service: Orthopedics;  Laterality: Right;  . CHOLECYSTECTOMY  2003   lap choli  . FISTULOGRAM N/A 10/26/2011   Procedure: FISTULOGRAM;  Surgeon: Rosetta Posner, MD;  Location[facility] CATH LAB;  Service: Cardiovascular;  Laterality: N/A;  . INCISIONAL HERNIA REPAIR  2007    History reviewed. No pertinent family history.  Social History   Socioeconomic History  . Marital status: Single    Spouse name: Not on file  . Number of children: Not on file  . Years of education: Not on file  . Highest education level: Not on file  Occupational History  . Not on file  Tobacco Use  . Smoking status: Former Research scientist (life sciences)  . Smokeless tobacco: Never Used  Substance and Sexual Activity  . Alcohol use: No  . Drug use: No    Comment: hx crack-last 1999  . Sexual activity: Not on file  Other Topics Concern  . Not on file  Social History Narrative   She lives with her DTR and grandchildren.     Social Determinants of Health   Financial Resource Strain:   . Difficulty of Paying Living Expenses:   Food Insecurity:   . Worried About Charity fundraiser in the Last Year:   . Arboriculturist in the Last Year:   Transportation Needs:   . Film/video editor (Medical):   Marland Kitchen Lack of Transportation (Non-Medical):   Physical Activity:   . Days of Exercise per Week:   . Minutes of Exercise per Session:   Stress:   . Feeling of Stress :   Social Connections:   . Frequency of Communication with Friends and Family:   . Frequency of Social Gatherings with Friends and Family:   . Attends Religious Services:   . Active Member of Clubs or Organizations:   . Attends Archivist Meetings:   Marland Kitchen Marital Status:   Intimate Partner Violence:   . Fear of Current or Ex-Partner:   . Emotionally Abused:   Marland Kitchen Physically Abused:   . Sexually Abused:     ROS Review of Systems  Constitutional: Negative for chills, fever and unexpected weight change.   HENT: Negative for congestion and sinus pressure.   Eyes: Negative.  Negative for visual disturbance.  Respiratory: Negative for cough and shortness of breath.        Gets SOB before dialysis secondary to fluid overload and then resolves after dialysis.    Cardiovascular: Negative for chest pain and leg swelling.  Gastrointestinal: Negative for abdominal pain, blood in stool, constipation and diarrhea.  Endocrine: Negative for cold intolerance, heat intolerance and polyuria.  Genitourinary:       ESRD- no urine on dialysis x >22 years  Musculoskeletal: Positive for arthralgias. Negative for back pain.       Bilat  shoulder and CTD- on the left- saw a Psychologist, sport and exercise.  Skin: Negative for rash.  Allergic/Immunologic: Negative for environmental allergies.  Neurological: Negative for dizziness, seizures, syncope and light-headedness.  Hematological: Does not bruise/bleed easily.  Psychiatric/Behavioral:       No concerns with depression or anxiety. Lives dtr and safe at home. Babysit a 36 mo old grandbaby    Objective:   Today's Vitals: BP 130/88 (BP Location: Left Wrist, Patient Position: Sitting, Cuff Size: Normal)   Pulse 96   Temp (!) 97.4 F (36.3 C) (Skin)   Ht 5\' 1"  (1.549 m)   Wt 179 lb (81.2 kg)   SpO2 99%   BMI 33.82 kg/m   Physical Exam Vitals reviewed.  Constitutional:      Appearance: Normal appearance. She is obese.  HENT:     Head: Normocephalic and atraumatic.  Eyes:     Conjunctiva/sclera: Conjunctivae normal.     Pupils: Pupils are equal, round, and reactive to light.     Comments: Sclera reddened bilat  Cardiovascular:     Rate and Rhythm: Normal rate and regular rhythm.     Heart sounds: Murmur present.     Comments: Occasional skipped beat.   Right arm AC space with large AV fistula with good thrill.  Left arm AC space with old non- functioning AV fistula.  Pulmonary:     Effort: Pulmonary effort is normal.     Breath sounds: Normal breath sounds.   Abdominal:     Palpations: Abdomen is soft. There is mass.     Comments: Positive firm mass at previous umbilical ventral surgical site-non tender and patient reports that has been there for years-unchanged.   Musculoskeletal:     Cervical back: Normal range of motion.     Right lower leg: No edema.     Left lower leg: No edema.     Comments: Wearing a black glove on left hand and reports tenderness over carpal tunnel. No swelling or deformity.   Skin:    General: Skin is warm and dry.  Neurological:     General: No focal deficit present.     Mental Status: She is alert and oriented to person, place, and time.  Psychiatric:        Mood and Affect: Mood normal.        Behavior: Behavior normal.     Comments: PHQ-9: 6 for tired and appetite. She denies feeling depressed. No SI.  GAD-7: 0     Assessment & Plan:   Problem List Items Addressed This Visit      Digestive   Chronic hepatitis, unspecified ([place])     Endocrine   Type 2 diabetes mellitus with diabetic chronic kidney disease ([place]) - Primary   Relevant Orders   Ambulatory referral to Ophthalmology     Nervous and Auditory   Carpal tunnel syndrome on left     Genitourinary   Hypertensive chronic kidney disease with stage 1 through stage 4 chronic kidney disease, or unspecified chronic kidney disease   Relevant Orders   Ambulatory referral to Ophthalmology     Other   Encounter for screening mammogram for malignant neoplasm of breast   Relevant Orders   MM 3D SCREEN BREAST BILATERAL      Outpatient Encounter Medications as of 01/17/2020  Medication Sig  . amLODipine (NORVASC) 10 MG tablet Take 10 mg by mouth at bedtime.  . cyclobenzaprine (FLEXERIL) 5 MG tablet Take 5 mg by mouth at bedtime as needed.  Marland Kitchen  ibuprofen (ADVIL,MOTRIN) 800 MG tablet Take 800 mg by mouth every 8 (eight) hours as needed for mild pain.   Marland Kitchen levothyroxine (SYNTHROID, LEVOTHROID) 50 MCG tablet Take 50 mcg by mouth daily.  Marland Kitchen losartan (COZAAR)  50 MG tablet Take 50 mg by mouth at bedtime.  . meloxicam (MOBIC) 15 MG tablet Take 1 tablet (15 mg total) by mouth daily.  . Methoxy PEG-Epoetin Beta (MIRCERA IJ) Mircera  . [DISCONTINUED] cinacalcet (SENSIPAR) 90 MG tablet Take 90 mg by mouth daily.  . [DISCONTINUED] glipiZIDE (GLUCOTROL XL) 2.5 MG 24 hr tablet Take 2.5 mg by mouth daily.  . [DISCONTINUED] amLODipine (NORVASC) 10 MG tablet Take by mouth.  . [DISCONTINUED] AURYXIA 1 GM 210 MG(Fe) tablet Take 2-3 tablets by mouth 5 (five) times daily as needed.  . [DISCONTINUED] calcium acetate (PHOSLO) 667 MG capsule Take 2,001 mg by mouth 3 (three) times daily with meals. May also take 2001mg  with each snack   No facility-administered encounter medications on file as of 01/17/2020.   Anna [name] comes in today to establish care.  She needs all routine preventative medical care as far as I can tell through the Epic system. She is getting specialty care through nephrology and dialysis. If she has a hx of chronic HCV- she will need [place] screening. She has a history of cardiomyopathy, and will need cardiac clearance if she wants to proceed to surgery in the future.  We are not ready at this time to clear her for carpal tunnel surgery.  She has been thinking about the surgery for several years. We will begin to work on her cancer screening with mammo, she declines colonoscopy may consider Cologuard- next visit. HCV requires labs and imaging.  She is unable to give many details about her PMH.  Records from your dialysis physicians.  I need to know what laboratory studies you had recently.  I can order any additional blood work through your dialysis center.  Referral in for an eye exam.  This is very important because of your longstanding high blood pressure. You will get a phone call from [facility] to schedule an appointment.  Please set up a appointment with me to obtain a complete physical exam and we can work towards getting your clearance for  your requested carpal tunnel surgery.    01/26/2020: Addendum:  Laboratory studies: 12/19/2019 hepatitis B surface antigen negative 07/21/2019 hepatitis B status of immunity 36.7.  She has immunity. 12/10/2015: HCV quant by PCR not detected 10/24/2012: Last liver imaging was an [place]:  IMPRESSION:   --Hepatomegaly with mild fatty infiltration. No intra-or extra hepatic ductal  dilatation.  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   Our staff reached out to her dialysis center and they have provided [place] with her most recent laboratory studies which have been abstracted.  She has not had any recent liver imaging studies.  They can draw lab work for [place], however we have to send the tubes needed for the draw and would also need to arrange for them to be picked up as they do not have a way to send off labs.    I will ask her nephrologist if we can draw blood distally out of her non-access arm. She has an old, nonfunctioning AV fistula.   Follow-up: Return in about 4 weeks (around 02/14/2020).  This visit occurred during the SARS-CoV-2 public health emergency.  Safety protocols were in place, including screening questions prior to the visit, additional usage of staff PPE,  and extensive cleaning of exam room while observing appropriate contact time as indicated for disinfecting solutions.   A total of 45 minutes of face to face time was spent with patient getting her medical history, medication list updated, social history, counseling regarding the above listed diagnoses, and coordination of care.We had a nice discussion today about managing healthcare risks, and how prevention can go a long way in keeping her active and healthier.  She babysits  her 75-month-old granddaughter who lives with her and it is important for her to stay well enough to be able to do this.  Anna Paradise, NP

## 2020-01-18 ENCOUNTER — Encounter: Payer: Self-pay | Admitting: Nurse Practitioner

## 2020-01-18 ENCOUNTER — Telehealth: Payer: Self-pay | Admitting: Nurse Practitioner

## 2020-01-18 DIAGNOSIS — G5602 Carpal tunnel syndrome, left upper limb: Secondary | ICD-10-CM | POA: Insufficient documentation

## 2020-01-18 DIAGNOSIS — Z4659 Encounter for fitting and adjustment of other gastrointestinal appliance and device: Secondary | ICD-10-CM | POA: Insufficient documentation

## 2020-01-18 NOTE — Telephone Encounter (Signed)
Please call her dialysis center and inquire if they have any labs they can share with [place]? [phone].  Do they have any information about the status of her Hep C virus?  She has been treated for it?   Do they have any recent imaging studies? [place] or CT?   Can I ask them to draw any routine labs that may need done  in dialysis?

## 2020-01-20 ENCOUNTER — Other Ambulatory Visit: Payer: Self-pay

## 2020-01-20 NOTE — Telephone Encounter (Signed)
I spoke with a nurse at center named [facility]. They sent over the most recent labs they had which I abstracted in system and are in records they sent. They have not done a Hep C panel on her since 2017 and she was not treated for hep C. They do not have any imaging on patient. Nurse said its very hard to get patient to see a doctor. I asked about labs being done there and she said they can do labs if we send the tubes needed for draw to them and they would also have to be picked up; they do not have a way to send labs off.

## 2020-01-21 DIAGNOSIS — L299 Pruritus, unspecified: Secondary | ICD-10-CM | POA: Diagnosis not present

## 2020-01-21 DIAGNOSIS — D631 Anemia in chronic kidney disease: Secondary | ICD-10-CM | POA: Diagnosis not present

## 2020-01-21 DIAGNOSIS — N2581 Secondary hyperparathyroidism of renal origin: Secondary | ICD-10-CM | POA: Diagnosis not present

## 2020-01-21 DIAGNOSIS — D689 Coagulation defect, unspecified: Secondary | ICD-10-CM | POA: Diagnosis not present

## 2020-01-21 DIAGNOSIS — E1122 Type 2 diabetes mellitus with diabetic chronic kidney disease: Secondary | ICD-10-CM | POA: Diagnosis not present

## 2020-01-21 DIAGNOSIS — Z992 Dependence on renal dialysis: Secondary | ICD-10-CM | POA: Diagnosis not present

## 2020-01-21 DIAGNOSIS — E0590 Thyrotoxicosis, unspecified without thyrotoxic crisis or storm: Secondary | ICD-10-CM | POA: Diagnosis not present

## 2020-01-21 DIAGNOSIS — N186 End stage renal disease: Secondary | ICD-10-CM | POA: Diagnosis not present

## 2020-01-23 DIAGNOSIS — D631 Anemia in chronic kidney disease: Secondary | ICD-10-CM | POA: Diagnosis not present

## 2020-01-23 DIAGNOSIS — E1122 Type 2 diabetes mellitus with diabetic chronic kidney disease: Secondary | ICD-10-CM | POA: Diagnosis not present

## 2020-01-23 DIAGNOSIS — N2581 Secondary hyperparathyroidism of renal origin: Secondary | ICD-10-CM | POA: Diagnosis not present

## 2020-01-23 DIAGNOSIS — E0590 Thyrotoxicosis, unspecified without thyrotoxic crisis or storm: Secondary | ICD-10-CM | POA: Diagnosis not present

## 2020-01-23 DIAGNOSIS — N186 End stage renal disease: Secondary | ICD-10-CM | POA: Diagnosis not present

## 2020-01-23 DIAGNOSIS — D689 Coagulation defect, unspecified: Secondary | ICD-10-CM | POA: Diagnosis not present

## 2020-01-23 DIAGNOSIS — Z992 Dependence on renal dialysis: Secondary | ICD-10-CM | POA: Diagnosis not present

## 2020-01-23 DIAGNOSIS — L299 Pruritus, unspecified: Secondary | ICD-10-CM | POA: Diagnosis not present

## 2020-01-26 ENCOUNTER — Encounter: Payer: Self-pay | Admitting: Nurse Practitioner

## 2020-01-26 NOTE — Telephone Encounter (Signed)
Can you call [facility] and ask her Nephrologist  if she can have blood drawn out of the non-access arm? She has one old AV fistula not in use. Could we draw from her lower arm/hand veins in that arm?

## 2020-01-28 DIAGNOSIS — D689 Coagulation defect, unspecified: Secondary | ICD-10-CM | POA: Diagnosis not present

## 2020-01-28 DIAGNOSIS — E0590 Thyrotoxicosis, unspecified without thyrotoxic crisis or storm: Secondary | ICD-10-CM | POA: Diagnosis not present

## 2020-01-28 DIAGNOSIS — L299 Pruritus, unspecified: Secondary | ICD-10-CM | POA: Diagnosis not present

## 2020-01-28 DIAGNOSIS — D631 Anemia in chronic kidney disease: Secondary | ICD-10-CM | POA: Diagnosis not present

## 2020-01-28 DIAGNOSIS — N2581 Secondary hyperparathyroidism of renal origin: Secondary | ICD-10-CM | POA: Diagnosis not present

## 2020-01-28 DIAGNOSIS — N186 End stage renal disease: Secondary | ICD-10-CM | POA: Diagnosis not present

## 2020-01-28 DIAGNOSIS — Z992 Dependence on renal dialysis: Secondary | ICD-10-CM | POA: Diagnosis not present

## 2020-01-28 DIAGNOSIS — E1122 Type 2 diabetes mellitus with diabetic chronic kidney disease: Secondary | ICD-10-CM | POA: Diagnosis not present

## 2020-01-29 NOTE — Telephone Encounter (Signed)
Spoke with [facility] and they said blood could not be drawn out of the non access arm

## 2020-01-29 NOTE — Telephone Encounter (Signed)
Who is her Nephrologist? Perhaps, I can page him/her and discuss this further. I am thinking I could send orders to her Nephrologist who can get the labs done. Can we get the Nephrologist's office phone number?

## 2020-01-30 DIAGNOSIS — E1122 Type 2 diabetes mellitus with diabetic chronic kidney disease: Secondary | ICD-10-CM | POA: Diagnosis not present

## 2020-01-30 DIAGNOSIS — D631 Anemia in chronic kidney disease: Secondary | ICD-10-CM | POA: Diagnosis not present

## 2020-01-30 DIAGNOSIS — N186 End stage renal disease: Secondary | ICD-10-CM | POA: Diagnosis not present

## 2020-01-30 DIAGNOSIS — Z992 Dependence on renal dialysis: Secondary | ICD-10-CM | POA: Diagnosis not present

## 2020-01-30 DIAGNOSIS — N2581 Secondary hyperparathyroidism of renal origin: Secondary | ICD-10-CM | POA: Diagnosis not present

## 2020-01-30 DIAGNOSIS — D689 Coagulation defect, unspecified: Secondary | ICD-10-CM | POA: Diagnosis not present

## 2020-01-30 DIAGNOSIS — E0590 Thyrotoxicosis, unspecified without thyrotoxic crisis or storm: Secondary | ICD-10-CM | POA: Diagnosis not present

## 2020-01-30 DIAGNOSIS — L299 Pruritus, unspecified: Secondary | ICD-10-CM | POA: Diagnosis not present

## 2020-02-03 ENCOUNTER — Telehealth: Payer: Self-pay | Admitting: Nurse Practitioner

## 2020-02-03 DIAGNOSIS — K760 Fatty (change of) liver, not elsewhere classified: Secondary | ICD-10-CM

## 2020-02-03 DIAGNOSIS — K739 Chronic hepatitis, unspecified: Secondary | ICD-10-CM

## 2020-02-03 DIAGNOSIS — E039 Hypothyroidism, unspecified: Secondary | ICD-10-CM

## 2020-02-03 DIAGNOSIS — D509 Iron deficiency anemia, unspecified: Secondary | ICD-10-CM

## 2020-02-03 DIAGNOSIS — E1169 Type 2 diabetes mellitus with other specified complication: Secondary | ICD-10-CM

## 2020-02-03 NOTE — Telephone Encounter (Signed)
Records came in last week. I do not see a TSH level, lipids, A1c, iron panel, B12/folate. She was negative for HCV in 2017. Dialysis does labs on her and gets the tubes to the lab. I reviewed their May labs. Is the reason they can't do these labs for [place] because of lack of tubes?   Alternative: Does she get lab work drawn from her feet? She is due to come in for a CPE with me on 02/14/20.   She does need abdominal US and a mammogram. She will need help in scheduling these and maybe could be on the same day at [facility]. We can address on OV Friday- please call her and remind her about the office visit- very important.   I will discuss with Dr. Nicki Reaper her advice on how to obtain routine lab work from a dialysis pt who has AV shunts in both arms- one is functioning and her nephrology team told us no blood draw in either arm. She is a diabetic. Can we have our lab draw from her feet?

## 2020-02-03 NOTE — Telephone Encounter (Signed)
If she is a dialysis pt, I would have them draw her labs at dialysis.

## 2020-02-04 DIAGNOSIS — N186 End stage renal disease: Secondary | ICD-10-CM | POA: Diagnosis not present

## 2020-02-04 DIAGNOSIS — D631 Anemia in chronic kidney disease: Secondary | ICD-10-CM | POA: Diagnosis not present

## 2020-02-04 DIAGNOSIS — Z992 Dependence on renal dialysis: Secondary | ICD-10-CM | POA: Diagnosis not present

## 2020-02-04 DIAGNOSIS — D689 Coagulation defect, unspecified: Secondary | ICD-10-CM | POA: Diagnosis not present

## 2020-02-04 DIAGNOSIS — L299 Pruritus, unspecified: Secondary | ICD-10-CM | POA: Diagnosis not present

## 2020-02-04 DIAGNOSIS — E1122 Type 2 diabetes mellitus with diabetic chronic kidney disease: Secondary | ICD-10-CM | POA: Diagnosis not present

## 2020-02-04 DIAGNOSIS — E0590 Thyrotoxicosis, unspecified without thyrotoxic crisis or storm: Secondary | ICD-10-CM | POA: Diagnosis not present

## 2020-02-04 DIAGNOSIS — N2581 Secondary hyperparathyroidism of renal origin: Secondary | ICD-10-CM | POA: Diagnosis not present

## 2020-02-04 NOTE — Telephone Encounter (Signed)
Patient sees Dr. Elmarie Shiley at [facility]. Their number is [phone]

## 2020-02-05 NOTE — Telephone Encounter (Signed)
Spoke with charge nurse at [facility] dialysis center. After discussion she stated we could fax over our lab orders and they cant draw patients labs there. Fax # [phone]. Lab orders printed and faxed to center for them to draw patients labs next week.

## 2020-02-05 NOTE — Addendum Note (Signed)
Addended by: Leeanne Rio on: 02/05/2020 04:52 PM   Modules accepted: Orders

## 2020-02-05 NOTE — Telephone Encounter (Signed)
Please call [facility] back and ask to speak to the charge nurse about getting my labs drawn. I spoke to Dr. Ulice Dash Patel's office and they say it is done all of the time. He cannot place the orders. But they should take a verbal order and the list of labs I need are ordered for future in our que.

## 2020-02-06 DIAGNOSIS — L299 Pruritus, unspecified: Secondary | ICD-10-CM | POA: Diagnosis not present

## 2020-02-06 DIAGNOSIS — N2581 Secondary hyperparathyroidism of renal origin: Secondary | ICD-10-CM | POA: Diagnosis not present

## 2020-02-06 DIAGNOSIS — E1122 Type 2 diabetes mellitus with diabetic chronic kidney disease: Secondary | ICD-10-CM | POA: Diagnosis not present

## 2020-02-06 DIAGNOSIS — D689 Coagulation defect, unspecified: Secondary | ICD-10-CM | POA: Diagnosis not present

## 2020-02-06 DIAGNOSIS — Z992 Dependence on renal dialysis: Secondary | ICD-10-CM | POA: Diagnosis not present

## 2020-02-06 DIAGNOSIS — N186 End stage renal disease: Secondary | ICD-10-CM | POA: Diagnosis not present

## 2020-02-06 DIAGNOSIS — D631 Anemia in chronic kidney disease: Secondary | ICD-10-CM | POA: Diagnosis not present

## 2020-02-06 DIAGNOSIS — E0590 Thyrotoxicosis, unspecified without thyrotoxic crisis or storm: Secondary | ICD-10-CM | POA: Diagnosis not present

## 2020-02-11 DIAGNOSIS — N186 End stage renal disease: Secondary | ICD-10-CM | POA: Diagnosis not present

## 2020-02-11 DIAGNOSIS — L299 Pruritus, unspecified: Secondary | ICD-10-CM | POA: Diagnosis not present

## 2020-02-11 DIAGNOSIS — Z992 Dependence on renal dialysis: Secondary | ICD-10-CM | POA: Diagnosis not present

## 2020-02-11 DIAGNOSIS — N2581 Secondary hyperparathyroidism of renal origin: Secondary | ICD-10-CM | POA: Diagnosis not present

## 2020-02-11 DIAGNOSIS — E1122 Type 2 diabetes mellitus with diabetic chronic kidney disease: Secondary | ICD-10-CM | POA: Diagnosis not present

## 2020-02-11 DIAGNOSIS — D689 Coagulation defect, unspecified: Secondary | ICD-10-CM | POA: Diagnosis not present

## 2020-02-11 DIAGNOSIS — E0590 Thyrotoxicosis, unspecified without thyrotoxic crisis or storm: Secondary | ICD-10-CM | POA: Diagnosis not present

## 2020-02-11 DIAGNOSIS — D631 Anemia in chronic kidney disease: Secondary | ICD-10-CM | POA: Diagnosis not present

## 2020-02-12 DIAGNOSIS — N186 End stage renal disease: Secondary | ICD-10-CM | POA: Diagnosis not present

## 2020-02-12 DIAGNOSIS — Z992 Dependence on renal dialysis: Secondary | ICD-10-CM | POA: Diagnosis not present

## 2020-02-12 DIAGNOSIS — I120 Hypertensive chronic kidney disease with stage 5 chronic kidney disease or end stage renal disease: Secondary | ICD-10-CM | POA: Diagnosis not present

## 2020-02-13 DIAGNOSIS — N186 End stage renal disease: Secondary | ICD-10-CM | POA: Diagnosis not present

## 2020-02-13 DIAGNOSIS — D689 Coagulation defect, unspecified: Secondary | ICD-10-CM | POA: Diagnosis not present

## 2020-02-13 DIAGNOSIS — N2581 Secondary hyperparathyroidism of renal origin: Secondary | ICD-10-CM | POA: Diagnosis not present

## 2020-02-13 DIAGNOSIS — D631 Anemia in chronic kidney disease: Secondary | ICD-10-CM | POA: Diagnosis not present

## 2020-02-13 DIAGNOSIS — D509 Iron deficiency anemia, unspecified: Secondary | ICD-10-CM | POA: Diagnosis not present

## 2020-02-13 DIAGNOSIS — Z992 Dependence on renal dialysis: Secondary | ICD-10-CM | POA: Diagnosis not present

## 2020-02-14 ENCOUNTER — Encounter: Payer: Medicare HMO | Admitting: Nurse Practitioner

## 2020-02-18 DIAGNOSIS — Z992 Dependence on renal dialysis: Secondary | ICD-10-CM | POA: Diagnosis not present

## 2020-02-18 DIAGNOSIS — D509 Iron deficiency anemia, unspecified: Secondary | ICD-10-CM | POA: Diagnosis not present

## 2020-02-18 DIAGNOSIS — N2581 Secondary hyperparathyroidism of renal origin: Secondary | ICD-10-CM | POA: Diagnosis not present

## 2020-02-18 DIAGNOSIS — D689 Coagulation defect, unspecified: Secondary | ICD-10-CM | POA: Diagnosis not present

## 2020-02-18 DIAGNOSIS — D631 Anemia in chronic kidney disease: Secondary | ICD-10-CM | POA: Diagnosis not present

## 2020-02-18 DIAGNOSIS — N186 End stage renal disease: Secondary | ICD-10-CM | POA: Diagnosis not present

## 2020-02-20 DIAGNOSIS — D689 Coagulation defect, unspecified: Secondary | ICD-10-CM | POA: Diagnosis not present

## 2020-02-20 DIAGNOSIS — N2581 Secondary hyperparathyroidism of renal origin: Secondary | ICD-10-CM | POA: Diagnosis not present

## 2020-02-20 DIAGNOSIS — D509 Iron deficiency anemia, unspecified: Secondary | ICD-10-CM | POA: Diagnosis not present

## 2020-02-20 DIAGNOSIS — N186 End stage renal disease: Secondary | ICD-10-CM | POA: Diagnosis not present

## 2020-02-20 DIAGNOSIS — D631 Anemia in chronic kidney disease: Secondary | ICD-10-CM | POA: Diagnosis not present

## 2020-02-20 DIAGNOSIS — Z992 Dependence on renal dialysis: Secondary | ICD-10-CM | POA: Diagnosis not present

## 2020-02-25 DIAGNOSIS — Z992 Dependence on renal dialysis: Secondary | ICD-10-CM | POA: Diagnosis not present

## 2020-02-25 DIAGNOSIS — D509 Iron deficiency anemia, unspecified: Secondary | ICD-10-CM | POA: Diagnosis not present

## 2020-02-25 DIAGNOSIS — D631 Anemia in chronic kidney disease: Secondary | ICD-10-CM | POA: Diagnosis not present

## 2020-02-25 DIAGNOSIS — N186 End stage renal disease: Secondary | ICD-10-CM | POA: Diagnosis not present

## 2020-02-25 DIAGNOSIS — N2581 Secondary hyperparathyroidism of renal origin: Secondary | ICD-10-CM | POA: Diagnosis not present

## 2020-02-25 DIAGNOSIS — D689 Coagulation defect, unspecified: Secondary | ICD-10-CM | POA: Diagnosis not present

## 2020-02-26 ENCOUNTER — Ambulatory Visit: Payer: Medicare HMO

## 2020-02-27 DIAGNOSIS — D509 Iron deficiency anemia, unspecified: Secondary | ICD-10-CM | POA: Diagnosis not present

## 2020-02-27 DIAGNOSIS — Z992 Dependence on renal dialysis: Secondary | ICD-10-CM | POA: Diagnosis not present

## 2020-02-27 DIAGNOSIS — D689 Coagulation defect, unspecified: Secondary | ICD-10-CM | POA: Diagnosis not present

## 2020-02-27 DIAGNOSIS — N186 End stage renal disease: Secondary | ICD-10-CM | POA: Diagnosis not present

## 2020-02-27 DIAGNOSIS — N2581 Secondary hyperparathyroidism of renal origin: Secondary | ICD-10-CM | POA: Diagnosis not present

## 2020-02-27 DIAGNOSIS — D631 Anemia in chronic kidney disease: Secondary | ICD-10-CM | POA: Diagnosis not present

## 2020-03-03 DIAGNOSIS — Z992 Dependence on renal dialysis: Secondary | ICD-10-CM | POA: Diagnosis not present

## 2020-03-03 DIAGNOSIS — D509 Iron deficiency anemia, unspecified: Secondary | ICD-10-CM | POA: Diagnosis not present

## 2020-03-03 DIAGNOSIS — N2581 Secondary hyperparathyroidism of renal origin: Secondary | ICD-10-CM | POA: Diagnosis not present

## 2020-03-03 DIAGNOSIS — D689 Coagulation defect, unspecified: Secondary | ICD-10-CM | POA: Diagnosis not present

## 2020-03-03 DIAGNOSIS — D631 Anemia in chronic kidney disease: Secondary | ICD-10-CM | POA: Diagnosis not present

## 2020-03-03 DIAGNOSIS — N186 End stage renal disease: Secondary | ICD-10-CM | POA: Diagnosis not present

## 2020-03-04 ENCOUNTER — Encounter: Payer: Medicare HMO | Admitting: Nurse Practitioner

## 2020-03-05 ENCOUNTER — Telehealth: Payer: Self-pay | Admitting: Nurse Practitioner

## 2020-03-05 DIAGNOSIS — D689 Coagulation defect, unspecified: Secondary | ICD-10-CM | POA: Diagnosis not present

## 2020-03-05 DIAGNOSIS — D631 Anemia in chronic kidney disease: Secondary | ICD-10-CM | POA: Diagnosis not present

## 2020-03-05 DIAGNOSIS — Z992 Dependence on renal dialysis: Secondary | ICD-10-CM | POA: Diagnosis not present

## 2020-03-05 DIAGNOSIS — D509 Iron deficiency anemia, unspecified: Secondary | ICD-10-CM | POA: Diagnosis not present

## 2020-03-05 DIAGNOSIS — N186 End stage renal disease: Secondary | ICD-10-CM | POA: Diagnosis not present

## 2020-03-05 DIAGNOSIS — N2581 Secondary hyperparathyroidism of renal origin: Secondary | ICD-10-CM | POA: Diagnosis not present

## 2020-03-05 NOTE — Telephone Encounter (Signed)
lft pt vm to call ofc regarding referral to  eye.

## 2020-03-10 ENCOUNTER — Telehealth: Payer: Self-pay | Admitting: Nurse Practitioner

## 2020-03-10 DIAGNOSIS — D689 Coagulation defect, unspecified: Secondary | ICD-10-CM | POA: Diagnosis not present

## 2020-03-10 DIAGNOSIS — D509 Iron deficiency anemia, unspecified: Secondary | ICD-10-CM | POA: Diagnosis not present

## 2020-03-10 DIAGNOSIS — N2581 Secondary hyperparathyroidism of renal origin: Secondary | ICD-10-CM | POA: Diagnosis not present

## 2020-03-10 DIAGNOSIS — Z992 Dependence on renal dialysis: Secondary | ICD-10-CM | POA: Diagnosis not present

## 2020-03-10 DIAGNOSIS — D631 Anemia in chronic kidney disease: Secondary | ICD-10-CM | POA: Diagnosis not present

## 2020-03-10 DIAGNOSIS — N186 End stage renal disease: Secondary | ICD-10-CM | POA: Diagnosis not present

## 2020-03-10 NOTE — Telephone Encounter (Signed)
Please make her a  f/up appt with me.   She declined [place] EYE exam appt.   Please confirm with [facility] that they will draw the labs ordered in future.

## 2020-03-10 NOTE — Telephone Encounter (Signed)
Rejection Reason - Patient Declined" [place] said 6 days ago  Pt cancelled appt.  msg sent to provider

## 2020-03-11 NOTE — Telephone Encounter (Signed)
[facility] faxed the lab results and are in results folder

## 2020-03-11 NOTE — Telephone Encounter (Signed)
Left message on patients voicemail to call back to scheduled follow up appt.   Spoke with [facility] to see if our labs were drawn that we faxed over. Nurse couldn't tell me if our labs were done and that they don't usually draw outside labs. I explained to her that I spoke with the charge nurse that day and she agreed to draw what we needed and to fax the orders over. She stated she would send over June and July labs that they have drawn today.

## 2020-03-12 DIAGNOSIS — D509 Iron deficiency anemia, unspecified: Secondary | ICD-10-CM | POA: Diagnosis not present

## 2020-03-12 DIAGNOSIS — D689 Coagulation defect, unspecified: Secondary | ICD-10-CM | POA: Diagnosis not present

## 2020-03-12 DIAGNOSIS — N186 End stage renal disease: Secondary | ICD-10-CM | POA: Diagnosis not present

## 2020-03-12 DIAGNOSIS — D631 Anemia in chronic kidney disease: Secondary | ICD-10-CM | POA: Diagnosis not present

## 2020-03-12 DIAGNOSIS — Z992 Dependence on renal dialysis: Secondary | ICD-10-CM | POA: Diagnosis not present

## 2020-03-12 DIAGNOSIS — N2581 Secondary hyperparathyroidism of renal origin: Secondary | ICD-10-CM | POA: Diagnosis not present

## 2020-03-14 DIAGNOSIS — I120 Hypertensive chronic kidney disease with stage 5 chronic kidney disease or end stage renal disease: Secondary | ICD-10-CM | POA: Diagnosis not present

## 2020-03-14 DIAGNOSIS — Z992 Dependence on renal dialysis: Secondary | ICD-10-CM | POA: Diagnosis not present

## 2020-03-14 DIAGNOSIS — N186 End stage renal disease: Secondary | ICD-10-CM | POA: Diagnosis not present

## 2020-03-17 ENCOUNTER — Telehealth: Payer: Self-pay | Admitting: Nurse Practitioner

## 2020-03-17 DIAGNOSIS — D631 Anemia in chronic kidney disease: Secondary | ICD-10-CM | POA: Diagnosis not present

## 2020-03-17 DIAGNOSIS — N186 End stage renal disease: Secondary | ICD-10-CM | POA: Diagnosis not present

## 2020-03-17 DIAGNOSIS — Z992 Dependence on renal dialysis: Secondary | ICD-10-CM | POA: Diagnosis not present

## 2020-03-17 DIAGNOSIS — D689 Coagulation defect, unspecified: Secondary | ICD-10-CM | POA: Diagnosis not present

## 2020-03-17 DIAGNOSIS — R52 Pain, unspecified: Secondary | ICD-10-CM | POA: Diagnosis not present

## 2020-03-17 DIAGNOSIS — N2581 Secondary hyperparathyroidism of renal origin: Secondary | ICD-10-CM | POA: Diagnosis not present

## 2020-03-17 NOTE — Telephone Encounter (Signed)
Left message for patient to call back and schedule Medicare Annual Wellness Visit (AWV)  ° °This should be a telephone visit only=30 minutes. ° °No hx of AWV; please schedule at anytime with Denisa O'Brien-Blaney at  [place] ° ° °

## 2020-03-19 DIAGNOSIS — D689 Coagulation defect, unspecified: Secondary | ICD-10-CM | POA: Diagnosis not present

## 2020-03-19 DIAGNOSIS — N2581 Secondary hyperparathyroidism of renal origin: Secondary | ICD-10-CM | POA: Diagnosis not present

## 2020-03-19 DIAGNOSIS — N186 End stage renal disease: Secondary | ICD-10-CM | POA: Diagnosis not present

## 2020-03-19 DIAGNOSIS — R52 Pain, unspecified: Secondary | ICD-10-CM | POA: Diagnosis not present

## 2020-03-19 DIAGNOSIS — Z992 Dependence on renal dialysis: Secondary | ICD-10-CM | POA: Diagnosis not present

## 2020-03-19 DIAGNOSIS — D631 Anemia in chronic kidney disease: Secondary | ICD-10-CM | POA: Diagnosis not present

## 2020-03-24 DIAGNOSIS — R52 Pain, unspecified: Secondary | ICD-10-CM | POA: Diagnosis not present

## 2020-03-24 DIAGNOSIS — Z992 Dependence on renal dialysis: Secondary | ICD-10-CM | POA: Diagnosis not present

## 2020-03-24 DIAGNOSIS — N186 End stage renal disease: Secondary | ICD-10-CM | POA: Diagnosis not present

## 2020-03-24 DIAGNOSIS — D631 Anemia in chronic kidney disease: Secondary | ICD-10-CM | POA: Diagnosis not present

## 2020-03-24 DIAGNOSIS — N2581 Secondary hyperparathyroidism of renal origin: Secondary | ICD-10-CM | POA: Diagnosis not present

## 2020-03-24 DIAGNOSIS — D689 Coagulation defect, unspecified: Secondary | ICD-10-CM | POA: Diagnosis not present

## 2020-03-26 DIAGNOSIS — N186 End stage renal disease: Secondary | ICD-10-CM | POA: Diagnosis not present

## 2020-03-26 DIAGNOSIS — D689 Coagulation defect, unspecified: Secondary | ICD-10-CM | POA: Diagnosis not present

## 2020-03-26 DIAGNOSIS — D631 Anemia in chronic kidney disease: Secondary | ICD-10-CM | POA: Diagnosis not present

## 2020-03-26 DIAGNOSIS — R52 Pain, unspecified: Secondary | ICD-10-CM | POA: Diagnosis not present

## 2020-03-26 DIAGNOSIS — Z992 Dependence on renal dialysis: Secondary | ICD-10-CM | POA: Diagnosis not present

## 2020-03-26 DIAGNOSIS — N2581 Secondary hyperparathyroidism of renal origin: Secondary | ICD-10-CM | POA: Diagnosis not present

## 2020-03-29 ENCOUNTER — Telehealth: Payer: Self-pay | Admitting: Nurse Practitioner

## 2020-03-29 NOTE — Telephone Encounter (Signed)
Please schedule a CPE in person  for diabetic foot exam, and to catch up on immunizations, mammo- ordered not done

## 2020-03-31 DIAGNOSIS — N186 End stage renal disease: Secondary | ICD-10-CM | POA: Diagnosis not present

## 2020-03-31 DIAGNOSIS — D689 Coagulation defect, unspecified: Secondary | ICD-10-CM | POA: Diagnosis not present

## 2020-03-31 DIAGNOSIS — Z992 Dependence on renal dialysis: Secondary | ICD-10-CM | POA: Diagnosis not present

## 2020-03-31 DIAGNOSIS — N2581 Secondary hyperparathyroidism of renal origin: Secondary | ICD-10-CM | POA: Diagnosis not present

## 2020-03-31 DIAGNOSIS — R52 Pain, unspecified: Secondary | ICD-10-CM | POA: Diagnosis not present

## 2020-03-31 DIAGNOSIS — D631 Anemia in chronic kidney disease: Secondary | ICD-10-CM | POA: Diagnosis not present

## 2020-04-01 NOTE — Telephone Encounter (Signed)
LMTCB to schedule CPE

## 2020-04-02 DIAGNOSIS — Z992 Dependence on renal dialysis: Secondary | ICD-10-CM | POA: Diagnosis not present

## 2020-04-02 DIAGNOSIS — R52 Pain, unspecified: Secondary | ICD-10-CM | POA: Diagnosis not present

## 2020-04-02 DIAGNOSIS — N186 End stage renal disease: Secondary | ICD-10-CM | POA: Diagnosis not present

## 2020-04-02 DIAGNOSIS — D689 Coagulation defect, unspecified: Secondary | ICD-10-CM | POA: Diagnosis not present

## 2020-04-02 DIAGNOSIS — N2581 Secondary hyperparathyroidism of renal origin: Secondary | ICD-10-CM | POA: Diagnosis not present

## 2020-04-02 DIAGNOSIS — D631 Anemia in chronic kidney disease: Secondary | ICD-10-CM | POA: Diagnosis not present

## 2020-04-02 NOTE — Telephone Encounter (Signed)
LMTCB to schedule CPE

## 2020-04-03 NOTE — Telephone Encounter (Signed)
LMTCB to schedule CPE; also letter sent to patient

## 2020-04-07 DIAGNOSIS — N2581 Secondary hyperparathyroidism of renal origin: Secondary | ICD-10-CM | POA: Diagnosis not present

## 2020-04-07 DIAGNOSIS — N186 End stage renal disease: Secondary | ICD-10-CM | POA: Diagnosis not present

## 2020-04-07 DIAGNOSIS — Z992 Dependence on renal dialysis: Secondary | ICD-10-CM | POA: Diagnosis not present

## 2020-04-07 DIAGNOSIS — D689 Coagulation defect, unspecified: Secondary | ICD-10-CM | POA: Diagnosis not present

## 2020-04-07 DIAGNOSIS — R52 Pain, unspecified: Secondary | ICD-10-CM | POA: Diagnosis not present

## 2020-04-07 DIAGNOSIS — D631 Anemia in chronic kidney disease: Secondary | ICD-10-CM | POA: Diagnosis not present

## 2020-04-09 DIAGNOSIS — N2581 Secondary hyperparathyroidism of renal origin: Secondary | ICD-10-CM | POA: Diagnosis not present

## 2020-04-09 DIAGNOSIS — R52 Pain, unspecified: Secondary | ICD-10-CM | POA: Diagnosis not present

## 2020-04-09 DIAGNOSIS — Z992 Dependence on renal dialysis: Secondary | ICD-10-CM | POA: Diagnosis not present

## 2020-04-09 DIAGNOSIS — Z1152 Encounter for screening for COVID-19: Secondary | ICD-10-CM | POA: Diagnosis not present

## 2020-04-09 DIAGNOSIS — N186 End stage renal disease: Secondary | ICD-10-CM | POA: Diagnosis not present

## 2020-04-09 DIAGNOSIS — D689 Coagulation defect, unspecified: Secondary | ICD-10-CM | POA: Diagnosis not present

## 2020-04-09 DIAGNOSIS — D631 Anemia in chronic kidney disease: Secondary | ICD-10-CM | POA: Diagnosis not present

## 2020-04-14 ENCOUNTER — Inpatient Hospital Stay
Admission: EM | Admit: 2020-04-14 | Discharge: 2020-05-15 | DRG: 208 | Disposition: E | Payer: Medicare HMO | Source: Ambulatory Visit | Attending: Pulmonary Disease | Admitting: Pulmonary Disease

## 2020-04-14 ENCOUNTER — Inpatient Hospital Stay: Payer: Medicare HMO

## 2020-04-14 ENCOUNTER — Emergency Department: Payer: Medicare HMO

## 2020-04-14 ENCOUNTER — Other Ambulatory Visit: Payer: Self-pay

## 2020-04-14 DIAGNOSIS — J80 Acute respiratory distress syndrome: Secondary | ICD-10-CM | POA: Diagnosis present

## 2020-04-14 DIAGNOSIS — I429 Cardiomyopathy, unspecified: Secondary | ICD-10-CM | POA: Diagnosis present

## 2020-04-14 DIAGNOSIS — N186 End stage renal disease: Secondary | ICD-10-CM

## 2020-04-14 DIAGNOSIS — J810 Acute pulmonary edema: Secondary | ICD-10-CM | POA: Diagnosis present

## 2020-04-14 DIAGNOSIS — J9620 Acute and chronic respiratory failure, unspecified whether with hypoxia or hypercapnia: Secondary | ICD-10-CM | POA: Diagnosis not present

## 2020-04-14 DIAGNOSIS — I4891 Unspecified atrial fibrillation: Secondary | ICD-10-CM | POA: Diagnosis present

## 2020-04-14 DIAGNOSIS — J9383 Other pneumothorax: Secondary | ICD-10-CM | POA: Diagnosis not present

## 2020-04-14 DIAGNOSIS — E1122 Type 2 diabetes mellitus with diabetic chronic kidney disease: Secondary | ICD-10-CM | POA: Diagnosis present

## 2020-04-14 DIAGNOSIS — I248 Other forms of acute ischemic heart disease: Secondary | ICD-10-CM | POA: Diagnosis present

## 2020-04-14 DIAGNOSIS — R578 Other shock: Secondary | ICD-10-CM | POA: Diagnosis not present

## 2020-04-14 DIAGNOSIS — R4182 Altered mental status, unspecified: Secondary | ICD-10-CM | POA: Diagnosis not present

## 2020-04-14 DIAGNOSIS — N170 Acute kidney failure with tubular necrosis: Secondary | ICD-10-CM | POA: Diagnosis not present

## 2020-04-14 DIAGNOSIS — N19 Unspecified kidney failure: Secondary | ICD-10-CM

## 2020-04-14 DIAGNOSIS — E8770 Fluid overload, unspecified: Secondary | ICD-10-CM | POA: Diagnosis present

## 2020-04-14 DIAGNOSIS — N2581 Secondary hyperparathyroidism of renal origin: Secondary | ICD-10-CM | POA: Diagnosis present

## 2020-04-14 DIAGNOSIS — G9341 Metabolic encephalopathy: Secondary | ICD-10-CM | POA: Diagnosis present

## 2020-04-14 DIAGNOSIS — Z4659 Encounter for fitting and adjustment of other gastrointestinal appliance and device: Secondary | ICD-10-CM

## 2020-04-14 DIAGNOSIS — J811 Chronic pulmonary edema: Secondary | ICD-10-CM | POA: Diagnosis not present

## 2020-04-14 DIAGNOSIS — E8729 Other acidosis: Secondary | ICD-10-CM

## 2020-04-14 DIAGNOSIS — Z9049 Acquired absence of other specified parts of digestive tract: Secondary | ICD-10-CM

## 2020-04-14 DIAGNOSIS — Z87891 Personal history of nicotine dependence: Secondary | ICD-10-CM | POA: Diagnosis not present

## 2020-04-14 DIAGNOSIS — J95811 Postprocedural pneumothorax: Secondary | ICD-10-CM | POA: Diagnosis not present

## 2020-04-14 DIAGNOSIS — I120 Hypertensive chronic kidney disease with stage 5 chronic kidney disease or end stage renal disease: Secondary | ICD-10-CM | POA: Diagnosis present

## 2020-04-14 DIAGNOSIS — Z01818 Encounter for other preprocedural examination: Secondary | ICD-10-CM

## 2020-04-14 DIAGNOSIS — Z992 Dependence on renal dialysis: Secondary | ICD-10-CM

## 2020-04-14 DIAGNOSIS — E039 Hypothyroidism, unspecified: Secondary | ICD-10-CM | POA: Diagnosis present

## 2020-04-14 DIAGNOSIS — Z79899 Other long term (current) drug therapy: Secondary | ICD-10-CM | POA: Diagnosis not present

## 2020-04-14 DIAGNOSIS — Z978 Presence of other specified devices: Secondary | ICD-10-CM

## 2020-04-14 DIAGNOSIS — D631 Anemia in chronic kidney disease: Secondary | ICD-10-CM | POA: Diagnosis not present

## 2020-04-14 DIAGNOSIS — J90 Pleural effusion, not elsewhere classified: Secondary | ICD-10-CM | POA: Diagnosis not present

## 2020-04-14 DIAGNOSIS — R7989 Other specified abnormal findings of blood chemistry: Secondary | ICD-10-CM

## 2020-04-14 DIAGNOSIS — Z7982 Long term (current) use of aspirin: Secondary | ICD-10-CM

## 2020-04-14 DIAGNOSIS — I517 Cardiomegaly: Secondary | ICD-10-CM | POA: Diagnosis not present

## 2020-04-14 DIAGNOSIS — J9621 Acute and chronic respiratory failure with hypoxia: Secondary | ICD-10-CM | POA: Diagnosis not present

## 2020-04-14 DIAGNOSIS — J9622 Acute and chronic respiratory failure with hypercapnia: Secondary | ICD-10-CM | POA: Diagnosis not present

## 2020-04-14 DIAGNOSIS — U071 COVID-19: Secondary | ICD-10-CM | POA: Diagnosis not present

## 2020-04-14 DIAGNOSIS — Z4682 Encounter for fitting and adjustment of non-vascular catheter: Secondary | ICD-10-CM | POA: Diagnosis not present

## 2020-04-14 DIAGNOSIS — I959 Hypotension, unspecified: Secondary | ICD-10-CM | POA: Diagnosis present

## 2020-04-14 DIAGNOSIS — J9601 Acute respiratory failure with hypoxia: Secondary | ICD-10-CM

## 2020-04-14 DIAGNOSIS — E872 Acidosis: Secondary | ICD-10-CM | POA: Diagnosis not present

## 2020-04-14 DIAGNOSIS — J9690 Respiratory failure, unspecified, unspecified whether with hypoxia or hypercapnia: Secondary | ICD-10-CM | POA: Diagnosis not present

## 2020-04-14 DIAGNOSIS — R778 Other specified abnormalities of plasma proteins: Secondary | ICD-10-CM

## 2020-04-14 DIAGNOSIS — I493 Ventricular premature depolarization: Secondary | ICD-10-CM | POA: Diagnosis present

## 2020-04-14 DIAGNOSIS — R0602 Shortness of breath: Secondary | ICD-10-CM | POA: Diagnosis not present

## 2020-04-14 DIAGNOSIS — J189 Pneumonia, unspecified organism: Secondary | ICD-10-CM | POA: Diagnosis not present

## 2020-04-14 DIAGNOSIS — I340 Nonrheumatic mitral (valve) insufficiency: Secondary | ICD-10-CM | POA: Diagnosis not present

## 2020-04-14 DIAGNOSIS — I10 Essential (primary) hypertension: Secondary | ICD-10-CM | POA: Diagnosis not present

## 2020-04-14 DIAGNOSIS — J1282 Pneumonia due to coronavirus disease 2019: Secondary | ICD-10-CM | POA: Diagnosis present

## 2020-04-14 DIAGNOSIS — J9600 Acute respiratory failure, unspecified whether with hypoxia or hypercapnia: Secondary | ICD-10-CM | POA: Diagnosis not present

## 2020-04-14 DIAGNOSIS — I499 Cardiac arrhythmia, unspecified: Secondary | ICD-10-CM | POA: Diagnosis not present

## 2020-04-14 DIAGNOSIS — N185 Chronic kidney disease, stage 5: Secondary | ICD-10-CM | POA: Diagnosis not present

## 2020-04-14 DIAGNOSIS — N189 Chronic kidney disease, unspecified: Secondary | ICD-10-CM | POA: Diagnosis not present

## 2020-04-14 DIAGNOSIS — R0902 Hypoxemia: Secondary | ICD-10-CM | POA: Diagnosis not present

## 2020-04-14 LAB — CBC WITH DIFFERENTIAL/PLATELET
Abs Immature Granulocytes: 0.05 10*3/uL (ref 0.00–0.07)
Basophils Absolute: 0 10*3/uL (ref 0.0–0.1)
Basophils Relative: 1 %
Eosinophils Absolute: 0 10*3/uL (ref 0.0–0.5)
Eosinophils Relative: 1 %
HCT: 35.6 % — ABNORMAL LOW (ref 36.0–46.0)
Hemoglobin: 11.7 g/dL — ABNORMAL LOW (ref 12.0–15.0)
Immature Granulocytes: 1 %
Lymphocytes Relative: 11 %
Lymphs Abs: 0.4 10*3/uL — ABNORMAL LOW (ref 0.7–4.0)
MCH: 28.7 pg (ref 26.0–34.0)
MCHC: 32.9 g/dL (ref 30.0–36.0)
MCV: 87.5 fL (ref 80.0–100.0)
Monocytes Absolute: 0.2 10*3/uL (ref 0.1–1.0)
Monocytes Relative: 5 %
Neutro Abs: 3.4 10*3/uL (ref 1.7–7.7)
Neutrophils Relative %: 81 %
Platelets: 210 10*3/uL (ref 150–400)
RBC: 4.07 MIL/uL (ref 3.87–5.11)
RDW: 15.4 % (ref 11.5–15.5)
WBC: 4.2 10*3/uL (ref 4.0–10.5)
nRBC: 0.7 % — ABNORMAL HIGH (ref 0.0–0.2)

## 2020-04-14 LAB — LACTIC ACID, PLASMA
Lactic Acid, Venous: 1.9 mmol/L (ref 0.5–1.9)
Lactic Acid, Venous: 1.2 mmol/L (ref 0.5–1.9)

## 2020-04-14 LAB — COMPREHENSIVE METABOLIC PANEL
ALT: 18 U/L (ref 0–44)
AST: 60 U/L — ABNORMAL HIGH (ref 15–41)
Albumin: 3.1 g/dL — ABNORMAL LOW (ref 3.5–5.0)
Alkaline Phosphatase: 65 U/L (ref 38–126)
Anion gap: 25 — ABNORMAL HIGH (ref 5–15)
BUN: 79 mg/dL — ABNORMAL HIGH (ref 8–23)
CO2: 22 mmol/L (ref 22–32)
Calcium: 8.3 mg/dL — ABNORMAL LOW (ref 8.9–10.3)
Chloride: 93 mmol/L — ABNORMAL LOW (ref 98–111)
Creatinine, Ser: 17.93 mg/dL — ABNORMAL HIGH (ref 0.44–1.00)
GFR calc Af Amer: 2 mL/min — ABNORMAL LOW (ref >60)
GFR calc non Af Amer: 2 mL/min — ABNORMAL LOW (ref >60)
Glucose, Bld: 149 mg/dL — ABNORMAL HIGH (ref 70–99)
Potassium: 4.2 mmol/L (ref 3.5–5.1)
Sodium: 140 mmol/L (ref 135–145)
Total Bilirubin: 1.3 mg/dL — ABNORMAL HIGH (ref 0.3–1.2)
Total Protein: 6.9 g/dL (ref 6.5–8.1)

## 2020-04-14 LAB — TROPONIN I (HIGH SENSITIVITY)
Troponin I (High Sensitivity): 324 ng/L (ref <18)
Troponin I (High Sensitivity): 284 ng/L (ref <18)

## 2020-04-14 LAB — BLOOD GAS, VENOUS
Acid-Base Excess: 0.1 mmol/L (ref 0.0–2.0)
Bicarbonate: 25.4 mmol/L (ref 20.0–28.0)
O2 Saturation: 46.3 %
Patient temperature: 37
pCO2, Ven: 43 mmHg — ABNORMAL LOW (ref 44.0–60.0)
pH, Ven: 7.38 (ref 7.250–7.430)
pO2, Ven: <31 mmHg — CL (ref 32.0–45.0)

## 2020-04-14 LAB — TSH: TSH: 3.896 u[IU]/mL (ref 0.350–4.500)

## 2020-04-14 LAB — PROCALCITONIN: Procalcitonin: 1.06 ng/mL

## 2020-04-14 LAB — GLUCOSE, CAPILLARY: Glucose-Capillary: 139 mg/dL — ABNORMAL HIGH (ref 70–99)

## 2020-04-14 LAB — MAGNESIUM: Magnesium: 2.3 mg/dL (ref 1.7–2.4)

## 2020-04-14 LAB — BRAIN NATRIURETIC PEPTIDE: B Natriuretic Peptide: 645.4 pg/mL — ABNORMAL HIGH (ref 0.0–100.0)

## 2020-04-14 LAB — SARS CORONAVIRUS 2 BY RT PCR (HOSPITAL ORDER, PERFORMED IN ~~LOC~~ HOSPITAL LAB): SARS Coronavirus 2: POSITIVE — AB

## 2020-04-14 LAB — FERRITIN: Ferritin: >7500 ng/mL — ABNORMAL HIGH (ref 11–307)

## 2020-04-14 MED ORDER — METHYLPREDNISOLONE SODIUM SUCC 40 MG IJ SOLR
40.0000 mg | Freq: Two times a day (BID) | INTRAMUSCULAR | Status: DC
  Administered 2020-04-15 – 2020-04-18 (×8): 40 mg via INTRAVENOUS
  Filled 2020-04-14 (×8): qty 1

## 2020-04-14 MED ORDER — HEPARIN SODIUM (PORCINE) 5000 UNIT/ML IJ SOLN
5000.0000 [IU] | Freq: Three times a day (TID) | INTRAMUSCULAR | Status: DC
  Administered 2020-04-14 – 2020-04-18 (×13): 5000 [IU] via SUBCUTANEOUS
  Filled 2020-04-14 (×13): qty 1

## 2020-04-14 MED ORDER — ACETAMINOPHEN 325 MG PO TABS: 650.0000 mg | ORAL_TABLET | Freq: Four times a day (QID) | ORAL | Status: DC | PRN

## 2020-04-14 MED ORDER — INSULIN ASPART 100 UNIT/ML ~~LOC~~ SOLN
0.0000 [IU] | Freq: Every day | SUBCUTANEOUS | Status: DC
  Administered 2020-04-16: 2 [IU] via SUBCUTANEOUS
  Filled 2020-04-14: qty 1

## 2020-04-14 MED ORDER — THIAMINE HCL 100 MG/ML IJ SOLN
100.0000 mg | Freq: Every day | INTRAMUSCULAR | Status: DC
  Administered 2020-04-15 – 2020-04-18 (×4): 100 mg via INTRAVENOUS
  Filled 2020-04-14 (×4): qty 2

## 2020-04-14 MED ORDER — ONDANSETRON HCL 4 MG/2ML IJ SOLN: 4.0000 mg | Freq: Four times a day (QID) | INTRAMUSCULAR | Status: DC | PRN

## 2020-04-14 MED ORDER — ASPIRIN 81 MG PO CHEW
325.0000 mg | CHEWABLE_TABLET | Freq: Once | ORAL | Status: AC
  Administered 2020-04-14: 325 mg via ORAL
  Filled 2020-04-14: qty 5

## 2020-04-14 MED ORDER — ALBUTEROL SULFATE HFA 108 (90 BASE) MCG/ACT IN AERS
2.0000 | INHALATION_SPRAY | Freq: Four times a day (QID) | RESPIRATORY_TRACT | Status: DC | PRN
  Filled 2020-04-14: qty 6.7

## 2020-04-14 MED ORDER — METHYLPREDNISOLONE SODIUM SUCC 125 MG IJ SOLR: 0.5000 mg/kg | Freq: Two times a day (BID) | INTRAMUSCULAR | Status: DC

## 2020-04-14 MED ORDER — INSULIN ASPART 100 UNIT/ML ~~LOC~~ SOLN
0.0000 [IU] | Freq: Three times a day (TID) | SUBCUTANEOUS | Status: DC
  Administered 2020-04-15 – 2020-04-16 (×5): 1 [IU] via SUBCUTANEOUS
  Filled 2020-04-14 (×5): qty 1

## 2020-04-14 MED ORDER — GUAIFENESIN-DM 100-10 MG/5ML PO SYRP
10.0000 mL | ORAL_SOLUTION | ORAL | Status: DC | PRN
  Filled 2020-04-14: qty 10

## 2020-04-14 MED ORDER — ZINC SULFATE 220 (50 ZN) MG PO CAPS
220.0000 mg | ORAL_CAPSULE | Freq: Every day | ORAL | Status: DC
  Filled 2020-04-14 (×2): qty 1

## 2020-04-14 MED ORDER — FOLIC ACID 5 MG/ML IJ SOLN
1.0000 mg | Freq: Every day | INTRAMUSCULAR | Status: DC
  Administered 2020-04-15 – 2020-04-18 (×4): 1 mg via INTRAVENOUS
  Filled 2020-04-14 (×6): qty 0.2

## 2020-04-14 MED ORDER — FERRIC CITRATE 1 GM 210 MG(FE) PO TABS
630.0000 mg | ORAL_TABLET | Freq: Three times a day (TID) | ORAL | Status: DC
  Filled 2020-04-14 (×6): qty 3

## 2020-04-14 MED ORDER — CHLORHEXIDINE GLUCONATE CLOTH 2 % EX PADS
6.0000 | MEDICATED_PAD | Freq: Every day | CUTANEOUS | Status: DC
  Administered 2020-04-14 – 2020-04-18 (×5): 6 via TOPICAL

## 2020-04-14 MED ORDER — SODIUM CHLORIDE 0.9 % IV SOLN
100.0000 mg | Freq: Every day | INTRAVENOUS | Status: AC
  Administered 2020-04-15 – 2020-04-17 (×3): 100 mg via INTRAVENOUS
  Filled 2020-04-14 (×3): qty 100

## 2020-04-14 MED ORDER — DEXAMETHASONE SODIUM PHOSPHATE 10 MG/ML IJ SOLN
6.0000 mg | INTRAMUSCULAR | Status: DC
  Administered 2020-04-14: 6 mg via INTRAVENOUS
  Filled 2020-04-14: qty 1

## 2020-04-14 MED ORDER — ASCORBIC ACID 500 MG PO TABS
500.0000 mg | ORAL_TABLET | Freq: Every day | ORAL | Status: DC
  Filled 2020-04-14 (×2): qty 1

## 2020-04-14 MED ORDER — LEVOTHYROXINE SODIUM 50 MCG PO TABS
50.0000 ug | ORAL_TABLET | Freq: Every day | ORAL | Status: DC
  Administered 2020-04-15 – 2020-04-16 (×2): 50 ug via ORAL
  Filled 2020-04-14 (×2): qty 1

## 2020-04-14 MED ORDER — ONDANSETRON HCL 4 MG PO TABS: 4.0000 mg | ORAL_TABLET | Freq: Four times a day (QID) | ORAL | Status: DC | PRN

## 2020-04-14 MED ORDER — AMLODIPINE BESYLATE 10 MG PO TABS
10.0000 mg | ORAL_TABLET | Freq: Every day | ORAL | Status: DC
  Administered 2020-04-14 – 2020-04-15 (×2): 10 mg via ORAL
  Filled 2020-04-14: qty 1
  Filled 2020-04-14 (×2): qty 2

## 2020-04-14 MED ORDER — LOSARTAN POTASSIUM 50 MG PO TABS
50.0000 mg | ORAL_TABLET | Freq: Every day | ORAL | Status: DC
  Administered 2020-04-14 – 2020-04-15 (×2): 50 mg via ORAL
  Filled 2020-04-14 (×2): qty 1

## 2020-04-14 MED ORDER — SODIUM CHLORIDE 0.9 % IV SOLN
200.0000 mg | Freq: Once | INTRAVENOUS | Status: AC
  Administered 2020-04-14: 200 mg via INTRAVENOUS
  Filled 2020-04-14: qty 200

## 2020-04-14 NOTE — Progress Notes (Signed)
Patient admitted to ICU-10 from ED. Report received by Suzanne Boron, RN. Patient hooked up to monitor, VSS with 02 sat at 88, on 15L nasal cannula. Remdesivir administered as ordered. See mar and flowsheets for more details.

## 2020-04-14 NOTE — ED Triage Notes (Signed)
Patient arrived via [facility] from dialysis due to o2 dropping to 33% RA per EMS at dialysis they placed her on 2L [place] which brought her O2 to low 60s. Temp was 97.3 EMS called and then they placed her on 15 L nonrebreather then dropped her to 6 L [place] which brought her to low 90s. Patient has decreased crackles bilaterally and hx of Afib.

## 2020-04-14 NOTE — H&P (Addendum)
Name: Anna [name] MRN: [id number] DOB: [birth year]    ADMISSION DATE:  04/02/2020 CONSULTATION DATE: 03/26/2020  REFERRING MD : Dr. Tamala Julian   CHIEF COMPLAINT: Shortness of Breath   BRIEF PATIENT DESCRIPTION:  64 female with ESRD on hemodialysis admitted with acute hypoxic hypercapnic respiratory failure secondary to pulmonary edema and COVID-19 requiring HFNC  SIGNIFICANT EVENTS/STUDIES:  08/31: Pt admitted to ICU requiring HFNC  08/31: CT Head revealed no acute intracranial abnormality.  HISTORY OF PRESENT ILLNESS:   This is a 64 yo with a PMH of Thyrotoxicosis, Morbid Obesity, Malignant HTN, Hyperparathyroidism, Hypercalcemia, Hepatitis C, ESRD, Type II Diabetes Mellitus, Cardiomyopathy, and Anemia.  She presented to [facility] ER on 08/31 from the dialysis center with O2 sats of 33% on room air.  Per ER notes she was diagnosed with COVID-19 last week and has had shortness of breath/cough.  In the ER COVID-19 positive, and CXR concerning for pulmonary edema/atypical pneumonia.  On call nephrologist contacted by ER physician, and plan is for pt to undergo hemodialysis on  04/15/2020.  Lab results revealed chloride 93, glucose 149, BUN 79, creatinine 17.93, calcium 8.3, anion gap 25, AST 60, BNP 645.4, troponin 324, pct 1.06, hgb 11.7, and pH 7.38/pCO2 43/bicarb 25.4.  She was placed 15L HFNC. She was subsequently admitted to ICU for additional workup and treatment.    PAST MEDICAL HISTORY :   has a past medical history of Anemia, AV fistula ([place]), Cardiomyopathy ([place]), Cardiomyopathy ([place]), Diabetes mellitus type 2 in obese ([place]), ESRD (end stage renal disease) ([place]), Full dentures, Hepatitis C, Hypercalcemia, Hyperparathyroidism, secondary ([place]), Malignant hypertension, Morbid obesity ([place]), and Thyrotoxicosis.  has a past surgical history that includes AV fistula placement (2013); Incisional hernia repair (2007); AV fistula placement (2008); AV fistula placement (2008); AV fistula repair (2008);  Cholecystectomy (2003); Carpal tunnel release (Right, 03/10/2014); Fistulogram (N/A, 10/26/2011); and A/V Fistulagram (N/A, 10/10/2016). Prior to Admission medications   Medication Sig Start Date End Date Taking? Authorizing Provider  amLODipine (NORVASC) 10 MG tablet Take 10 mg by mouth at bedtime. 06/02/19  Yes [provider]  AURYXIA 1 GM 210 MG(Fe) tablet Take 630 mg by mouth 3 (three) times daily with meals.  02/03/20  Yes [provider]  levothyroxine (SYNTHROID, LEVOTHROID) 50 MCG tablet Take 50 mcg by mouth daily. 08/04/16  Yes [provider]  losartan (COZAAR) 50 MG tablet Take 50 mg by mouth at bedtime. 07/04/19  Yes [provider]  ibuprofen (ADVIL,MOTRIN) 800 MG tablet Take 800 mg by mouth every 8 (eight) hours as needed for mild pain.     [provider]  meloxicam (MOBIC) 15 MG tablet Take 1 tablet (15 mg total) by mouth daily. Patient not taking: Reported on 03/22/2020 05/22/16   Victorino Dike, FNP   Allergies  Allergen Reactions  . Latex Itching    LATEX GLOVES    FAMILY HISTORY:  family history is not on file. SOCIAL HISTORY:  reports that she has quit smoking. She has never used smokeless tobacco. She reports that she does not drink alcohol and does not use drugs.  REVIEW OF SYSTEMS: Positives in BOLD  Constitutional: Negative for fever, chills, weight loss, malaise/fatigue and diaphoresis.  HENT: Negative for hearing loss, ear pain, nosebleeds, congestion, sore throat, neck pain, tinnitus and ear discharge.   Eyes: Negative for blurred vision, double vision, photophobia, pain, discharge and redness.  Respiratory: cough, hemoptysis, sputum production, shortness of breath, wheezing and stridor.   Cardiovascular: Negative for chest  pain, palpitations, orthopnea, claudication, leg swelling and PND.  Gastrointestinal: Negative for heartburn, nausea, vomiting, abdominal pain, diarrhea, constipation, blood in stool and melena.    Genitourinary: Negative for dysuria, urgency, frequency, hematuria and flank pain.  Musculoskeletal: Negative for myalgias, back pain, joint pain and falls.  Skin: Negative for itching and rash.  Neurological: Negative for dizziness, tingling, tremors, sensory change, speech change, focal weakness, seizures, loss of consciousness, weakness and headaches.  Endo/Heme/Allergies: Negative for environmental allergies and polydipsia. Does not bruise/bleed easily.  SUBJECTIVE:  No complaints at this time  VITAL SIGNS: Temp:  [97.9 F (36.6 C)] 97.9 F (36.6 C) (08/31 1736) Pulse Rate:  [87-91] 87 (08/31 2100) Resp:  [24-30] 24 (08/31 2100) BP: (141-156)/(77-120) 141/77 (08/31 2100) SpO2:  [89 %-91 %] 89 % (08/31 2100) FiO2 (%):  [100 %] 100 % (08/31 1755) Weight:  [81.2 kg] 81.2 kg (08/31 1739)  PHYSICAL EXAMINATION: General: acutely ill appearing female, NAD on HFNC  Neuro: alert, oriented to self and place only, follows commands, PERRLA HEENT: supple, no JVD Cardiovascular: irregular irregular, no R/G  Lungs: faint crackles throughout, even, non labored  Abdomen: +BS x4, obese, soft, non tender, non distended  Musculoskeletal: 1+ bilateral lower extremity edema Skin: RUE av fistula +bruit and thrill  Recent Labs  Lab 04/08/2020 1742  NA 140  K 4.2  CL 93*  CO2 22  BUN 79*  CREATININE 17.93*  GLUCOSE 149*   Recent Labs  Lab 03/23/2020 1742  HGB 11.7*  HCT 35.6*  WBC 4.2  PLT 210   DG Chest 1 View  Result Date: 04/13/2020 CLINICAL DATA:  Shortness of breath.  Dialysis patient. EXAM: CHEST  1 VIEW COMPARISON:  07/31/2019 FINDINGS: Extensive support apparatus artifact projecting over the right apex. Patient rotated minimally right. Moderate to marked enlargement of the cardiopericardial silhouette. Suspect small bilateral pleural effusions. No pneumothorax. Relatively diffuse interstitial and airspace disease, with relative sparing of the apices. IMPRESSION: Cardiomegaly with  diffuse interstitial and airspace disease. Given clinical history, most likely due to pulmonary edema. Atypical infection or aspiration could look similar. Suspicion of layering bilateral pleural effusions. Electronically Signed   By: Abigail Miyamoto M.D.   On: 03/23/2020 18:13    ASSESSMENT / PLAN:  Acute hypoxic hypercapnic respiratory failure secondary to pulmonary edema in the setting of ESRD and COVID-19 Hx: Morbid obesity  Prn supplemental O2 for dyspnea and/or hypoxia  Continue remdesivir, vitamins, and iv steroids Trend inflammatory markers  Prn bronchodilator therapy Self-proning as tolerated  Encourage OOB to chair  Aggressive pulmonary hygiene  Malignant HTN Elevated troponin likely secondary to demand ischemia in the setting of acute respiratory failure  Hx: Cardiomyopathy Continuous telemetry monitoring  Continue outpatient antihypertensives  Trend troponin's   ESRD on hemodialysis  Trend BMP  Replace electrolytes as indicated  Avoid nephrotoxic medications   Anemia without obvious signs of acute blood loss VTE px: subq heparin and SCD's  Trend CBC  Monitor s/sx of bleeding and transfuse hgb <7  Type II diabetes mellitus  CBG's ac/hs SSI   Acute encephalopathy likely secondary to infectious process  Frequent reorientation  Avoid sedating medications    Marda Stalker, [place] Pager [phone] (please enter 7 digits) PCCM Consult Pager [phone] (please enter 7 digits)

## 2020-04-14 NOTE — ED Notes (Signed)
Pt transported to CT ?

## 2020-04-14 NOTE — ED Provider Notes (Signed)
[facility] Emergency Department Provider Note  ____________________________________________   First MD Initiated Contact with Patient 03/19/2020 1728     (approximate)  I have reviewed the triage vital signs and the nursing notes.   HISTORY  Chief Complaint Shortness of Breath   HPI Anna [name] is a 64 y.o. female with a past medical history of A-fib, ESRD on HD typically Tuesday, Thursday, and Saturday, cardiomegaly, DM, thyroid disease, and HTN who presents via EMS from her dialysis center after she presented today for dialysis but was found to have a pulse ox of 33% room air.  Patient was reportedly diagnosed with Covid sometime last week as has had some shortness of breath and cough since then.  She does not typically wear any oxygen.  Patient is somewhat confused on arrival unable provide any additional significant history although she denies any active pain including chest pain, dental pain, headache, back pain, earache, sore throat, extremity pain, or other acute complaints.  Denies any recent falls or injuries.  She does endorse cough and shortness of breath.         Past Medical History:  Diagnosis Date  . Anemia   . AV fistula (HCC)    in place-rt upper arm  . Cardiomyopathy ([place])   . Cardiomyopathy ([place])   . Diabetes mellitus type 2 in obese ([place])   . ESRD (end stage renal disease) ([place])   . Full dentures    lost them-no teeth  . Hepatitis C   . Hypercalcemia   . Hyperparathyroidism, secondary ([place])   . Malignant hypertension   . Morbid obesity ([place])   . Thyrotoxicosis     Patient Active Problem List   Diagnosis Date Noted  . Encounter for screening mammogram for malignant neoplasm of breast 01/18/2020  . Carpal tunnel syndrome on left 01/18/2020  . Hypercalcemia 12/30/2019  . Thyrotoxicosis, unspecified without thyrotoxic crisis or storm 09/21/2016  . Iron deficiency anemia, unspecified 03/13/2015  . Type 2 diabetes mellitus  with diabetic chronic kidney disease ([place]) 01/15/2015  . Chronic hepatitis, unspecified ([place]) 05/02/2014  . Diarrhea, unspecified 11/11/2013  . Headache, unspecified 11/11/2013  . Hypoglycemia, unspecified 11/11/2013  . Pruritus, unspecified 11/11/2013  . Coagulation defect, unspecified ([place]) 10/30/2013  . Fever, unspecified 08/14/2013  . Chronic viral hepatitis C ([place]) 08/06/2009  . Anemia in chronic kidney disease 07/17/2007  . Secondary hyperparathyroidism of renal origin ([place]) 06/10/2003  . Dependence on renal dialysis ([place]) 06/23/1998  . Other psychoactive substance dependence, uncomplicated ([place]) 25/12/3974  . 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 End stage renal disease ([place]) 06/03/1998  . Hypertensive chronic kidney disease with stage 1 through stage 4 chronic kidney disease, or unspecified chronic kidney disease 06/03/1998    Past Surgical History:  Procedure Laterality Date  . A/V FISTULAGRAM N/A 10/10/2016   Procedure: A/V Fistulagram;  Surgeon: Algernon Huxley, MD;  Location: [facility];  Service: Cardiovascular;  Laterality: N/A;  . AV FISTULA PLACEMENT  2013   right upper arm  . AV FISTULA PLACEMENT  2008   rt upper arm  . AV FISTULA PLACEMENT  2008   left  . AV FISTULA REPAIR  2008   left taken out  . CARPAL TUNNEL RELEASE Right 03/10/2014   Procedure: CARPAL TUNNEL RELEASE;  Surgeon: Tennis Must, MD;  Location: [facility];  Service: Orthopedics;  Laterality: Right;  . CHOLECYSTECTOMY  2003   lap [place]  . FISTULOGRAM N/A  10/26/2011   Procedure: FISTULOGRAM;  Surgeon: Rosetta Posner, MD;  Location: [facility] CATH LAB;  Service: Cardiovascular;  Laterality: N/A;  . INCISIONAL HERNIA REPAIR  2007    Prior to Admission medications   Medication Sig Start Date End Date Taking? Authorizing Provider  amLODipine (NORVASC) 10 MG tablet Take 10 mg by mouth at bedtime. 06/02/19  Yes [provider]  AURYXIA 1 GM 210 MG(Fe) tablet Take 630 mg by  mouth 3 (three) times daily with meals.  02/03/20  Yes [provider]  levothyroxine (SYNTHROID, LEVOTHROID) 50 MCG tablet Take 50 mcg by mouth daily. 08/04/16  Yes [provider]  losartan (COZAAR) 50 MG tablet Take 50 mg by mouth at bedtime. 07/04/19  Yes [provider]  ibuprofen (ADVIL,MOTRIN) 800 MG tablet Take 800 mg by mouth every 8 (eight) hours as needed for mild pain.     [provider]  meloxicam (MOBIC) 15 MG tablet Take 1 tablet (15 mg total) by mouth daily. Patient not taking: Reported on 04/01/2020 05/22/16   Sherrie George B, FNP    Allergies Latex  No family history on file.  Social History Social History   Tobacco Use  . Smoking status: Former Research scientist (life sciences)  . Smokeless tobacco: Never Used  Substance Use Topics  . Alcohol use: No  . Drug use: No    Comment: hx crack-last 1999    Review of Systems  Review of Systems  Unable to perform ROS: Severe respiratory distress      ____________________________________________   PHYSICAL EXAM:  VITAL SIGNS: ED Triage Vitals  Enc Vitals Group     BP      Pulse      Resp      Temp      Temp src      SpO2      Weight      Height      Head Circumference      Peak Flow      Pain Score      Pain Loc      Pain Edu?      Excl. in [place]?    Vitals:   04/11/2020 1736 04/11/2020 1755  BP: (!) 156/120   Pulse: 91   Resp: (!) 30   Temp: 97.9 F (36.6 C)   SpO2: 91% 91%   Physical Exam Vitals and nursing note reviewed.  Constitutional:      General: She is not in acute distress.    Appearance: She is well-developed.  HENT:     Head: Normocephalic and atraumatic.     Right Ear: External ear normal.     Left Ear: External ear normal.     Nose: Nose normal.     Mouth/Throat:     Mouth: Mucous membranes are dry.  Eyes:     Conjunctiva/sclera: Conjunctivae normal.  Cardiovascular:     Rate and Rhythm: Normal rate and regular rhythm.     Heart sounds: No murmur heard.    Pulmonary:     Effort: Tachypnea, accessory muscle usage and respiratory distress present.     Breath sounds: Examination of the right-middle field reveals rhonchi. Examination of the left-middle field reveals rhonchi. Examination of the right-lower field reveals rhonchi. Examination of the left-lower field reveals rhonchi. Rhonchi present.  Abdominal:     Palpations: Abdomen is soft.     Tenderness: There is no abdominal tenderness.  Musculoskeletal:     Cervical back: Neck supple.  Skin:  General: Skin is warm and dry.     Capillary Refill: Capillary refill takes less than 2 seconds.  Neurological:     General: No focal deficit present.     Mental Status: She is alert.      ____________________________________________   LABS (all labs ordered are listed, but only abnormal results are displayed)  Labs Reviewed  SARS CORONAVIRUS 2 BY RT PCR ([facility]) - Abnormal; Notable for the following components:      Result Value   SARS Coronavirus 2 POSITIVE (*)    All other components within normal limits  BLOOD GAS, VENOUS - Abnormal; Notable for the following components:   pCO2, Ven 43 (*)    pO2, Ven <31.0 (*)    All other components within normal limits  CBC WITH DIFFERENTIAL/PLATELET - Abnormal; Notable for the following components:   Hemoglobin 11.7 (*)    HCT 35.6 (*)    nRBC 0.7 (*)    Lymphs Abs 0.4 (*)    All other components within normal limits  COMPREHENSIVE METABOLIC PANEL - Abnormal; Notable for the following components:   Chloride 93 (*)    Glucose, Bld 149 (*)    BUN 79 (*)    Creatinine, Ser 17.93 (*)    Calcium 8.3 (*)    Albumin 3.1 (*)    AST 60 (*)    Total Bilirubin 1.3 (*)    GFR calc non Af Amer 2 (*)    GFR calc Af Amer 2 (*)    Anion gap 25 (*)    All other components within normal limits  BRAIN NATRIURETIC PEPTIDE - Abnormal; Notable for the following components:   B Natriuretic Peptide 645.4 (*)     All other components within normal limits  TROPONIN I (HIGH SENSITIVITY) - Abnormal; Notable for the following components:   Troponin I (High Sensitivity) 324 (*)    All other components within normal limits  MAGNESIUM  PROCALCITONIN  LACTIC ACID, PLASMA  TSH  LACTIC ACID, PLASMA  TROPONIN I (HIGH SENSITIVITY)   ____________________________________________  EKG  A. fib with a ventricular rate of 88, left axis deviation, several PVCs, and somewhat poor tracing in multiple leads with no other clear evidence of acute ischemia. ____________________________________________  RADIOLOGY  ED MD interpretation: Bilateral patchy is consistent with pneumonia.  No obvious pneumothorax.  Official radiology report(s): DG Chest 1 View  Result Date: 04/11/2020 CLINICAL DATA:  Shortness of breath.  Dialysis patient. EXAM: CHEST  1 VIEW COMPARISON:  07/31/2019 FINDINGS: Extensive support apparatus artifact projecting over the right apex. Patient rotated minimally right. Moderate to marked enlargement of the cardiopericardial silhouette. Suspect small bilateral pleural effusions. No pneumothorax. Relatively diffuse interstitial and airspace disease, with relative sparing of the apices. IMPRESSION: Cardiomegaly with diffuse interstitial and airspace disease. Given clinical history, most likely due to pulmonary edema. Atypical infection or aspiration could look similar. Suspicion of layering bilateral pleural effusions. Electronically Signed   By: Abigail Miyamoto M.D.   On: 04/08/2020 18:13    ____________________________________________   PROCEDURES  Procedure(s) performed (including Critical Care):  .1-3 Lead EKG Interpretation Performed by: Lucrezia Starch, MD Authorized by: Lucrezia Starch, MD     Interpretation: abnormal     ECG rate:  88   Rhythm: atrial fibrillation     Ectopy: PVCs     Conduction: normal   .Critical Care Performed by: Lucrezia Starch, MD Authorized by: Lucrezia Starch, MD  Critical care provider statement:    Critical care time (minutes):  45   Critical care time was exclusive of:  Separately billable procedures and treating other patients   Critical care was necessary to treat or prevent imminent or life-threatening deterioration of the following conditions:  Respiratory failure   Critical care was time spent personally by me on the following activities:  Discussions with consultants, evaluation of patient's response to treatment, examination of patient, ordering and performing treatments and interventions, ordering and review of laboratory studies, ordering and review of radiographic studies, pulse oximetry, re-evaluation of patient's condition, obtaining history from patient or surrogate and review of old charts     ____________________________________________   INITIAL IMPRESSION / [place] / ED COURSE        Overall patient's history, exam, and ED work-up is consistent with acute hypoxic respiratory failure secondary to Covid pneumonia in the setting of missed dialysis and mild volume overload.  In addition patient is noted to be in A. Fib without rapid response and her troponin is noted to be elevated in the low 100s.  This is likely secondary to mild demand ischemia.  ASA given.  No clear evidence of acute ischemia on ECG and a low suspicion for acute coronary process at this time.  Patient does appear slightly volume overloaded on chest x-ray and her BNP is slightly elevated.  I did discuss patient's presentation and work-up with on-call nephrologist who stated they would dialyze the patient soon as possible.  Patient was placed on OptiFlow for oxygen support.  VBG does not show evidence of hypercarbic respiratory failure.  High anion gap metabolic acidosis likely secondary to uremia.  We will plan to admit to medical ICU for further evaluation management.  Medications  dexamethasone (DECADRON) injection 6 mg (6 mg Intravenous  Given 04/13/2020 1847)  amLODipine (NORVASC) tablet 10 mg (10 mg Oral Given 03/28/2020 1824)  losartan (COZAAR) tablet 50 mg (50 mg Oral Given 04/01/2020 1823)  aspirin chewable tablet 325 mg (has no administration in time range)    ____________________________________________   FINAL CLINICAL IMPRESSION(S) / ED DIAGNOSES  Final diagnoses:  Acute respiratory failure with hypoxia (HCC)  ESRD (end stage renal disease) (HCC)  Troponin I above reference range  COVID-19  High anion gap metabolic acidosis  Uremia    Medications  dexamethasone (DECADRON) injection 6 mg (6 mg Intravenous Given 03/21/2020 1847)  amLODipine (NORVASC) tablet 10 mg (10 mg Oral Given 03/21/2020 1824)  losartan (COZAAR) tablet 50 mg (50 mg Oral Given 04/04/2020 1823)  aspirin chewable tablet 325 mg (has no administration in time range)     ED Discharge Orders    None       Note:  This document was prepared using Dragon voice recognition software and may include unintentional dictation errors.   Lucrezia Starch, MD 04/03/2020 [phone]

## 2020-04-14 NOTE — Progress Notes (Signed)
Remdesivir - Pharmacy Brief Note   O:  ALT: 18  CXR:  SpO2: low 90 % on 6L [place]    A/P:  Remdesivir 200 mg IVPB once followed by 100 mg IVPB daily x 4 days.   Ashlynne Shetterly D 03/31/2020 9:13 PM

## 2020-04-15 ENCOUNTER — Inpatient Hospital Stay: Payer: Medicare HMO

## 2020-04-15 LAB — COMPREHENSIVE METABOLIC PANEL
ALT: 15 U/L (ref 0–44)
AST: 48 U/L — ABNORMAL HIGH (ref 15–41)
Albumin: 2.9 g/dL — ABNORMAL LOW (ref 3.5–5.0)
Alkaline Phosphatase: 59 U/L (ref 38–126)
Anion gap: 22 — ABNORMAL HIGH (ref 5–15)
BUN: 85 mg/dL — ABNORMAL HIGH (ref 8–23)
CO2: 22 mmol/L (ref 22–32)
Calcium: 8.2 mg/dL — ABNORMAL LOW (ref 8.9–10.3)
Chloride: 95 mmol/L — ABNORMAL LOW (ref 98–111)
Creatinine, Ser: 18.58 mg/dL — ABNORMAL HIGH (ref 0.44–1.00)
GFR calc Af Amer: 2 mL/min — ABNORMAL LOW (ref >60)
GFR calc non Af Amer: 2 mL/min — ABNORMAL LOW (ref >60)
Glucose, Bld: 197 mg/dL — ABNORMAL HIGH (ref 70–99)
Potassium: 4.5 mmol/L (ref 3.5–5.1)
Sodium: 139 mmol/L (ref 135–145)
Total Bilirubin: 1.6 mg/dL — ABNORMAL HIGH (ref 0.3–1.2)
Total Protein: 6.6 g/dL (ref 6.5–8.1)

## 2020-04-15 LAB — HIV ANTIBODY (ROUTINE TESTING W REFLEX): HIV Screen 4th Generation wRfx: NONREACTIVE

## 2020-04-15 LAB — CBC WITH DIFFERENTIAL/PLATELET
Abs Immature Granulocytes: 0.06 10*3/uL (ref 0.00–0.07)
Basophils Absolute: 0 10*3/uL (ref 0.0–0.1)
Basophils Relative: 0 %
Eosinophils Absolute: 0 10*3/uL (ref 0.0–0.5)
Eosinophils Relative: 0 %
HCT: 34.6 % — ABNORMAL LOW (ref 36.0–46.0)
Hemoglobin: 11.9 g/dL — ABNORMAL LOW (ref 12.0–15.0)
Immature Granulocytes: 2 %
Lymphocytes Relative: 11 %
Lymphs Abs: 0.4 10*3/uL — ABNORMAL LOW (ref 0.7–4.0)
MCH: 29.4 pg (ref 26.0–34.0)
MCHC: 34.4 g/dL (ref 30.0–36.0)
MCV: 85.4 fL (ref 80.0–100.0)
Monocytes Absolute: 0.1 10*3/uL (ref 0.1–1.0)
Monocytes Relative: 3 %
Neutro Abs: 2.9 10*3/uL (ref 1.7–7.7)
Neutrophils Relative %: 84 %
Platelets: 187 10*3/uL (ref 150–400)
RBC: 4.05 MIL/uL (ref 3.87–5.11)
RDW: 15.1 % (ref 11.5–15.5)
WBC: 3.5 10*3/uL — ABNORMAL LOW (ref 4.0–10.5)
nRBC: 0.6 % — ABNORMAL HIGH (ref 0.0–0.2)

## 2020-04-15 LAB — GLUCOSE, CAPILLARY
Glucose-Capillary: 183 mg/dL — ABNORMAL HIGH (ref 70–99)
Glucose-Capillary: 175 mg/dL — ABNORMAL HIGH (ref 70–99)
Glucose-Capillary: 112 mg/dL — ABNORMAL HIGH (ref 70–99)
Glucose-Capillary: 134 mg/dL — ABNORMAL HIGH (ref 70–99)

## 2020-04-15 LAB — FIBRIN DERIVATIVES D-DIMER (ARMC ONLY)
Fibrin derivatives D-dimer (ARMC): 1210.78 ng/mL (FEU) — ABNORMAL HIGH (ref 0.00–499.00)
Fibrin derivatives D-dimer (ARMC): 1065.41 ng/mL (FEU) — ABNORMAL HIGH (ref 0.00–499.00)

## 2020-04-15 LAB — FERRITIN: Ferritin: >7500 ng/mL — ABNORMAL HIGH (ref 11–307)

## 2020-04-15 LAB — PHOSPHORUS: Phosphorus: 8.8 mg/dL — ABNORMAL HIGH (ref 2.5–4.6)

## 2020-04-15 LAB — C-REACTIVE PROTEIN
CRP: 20.6 mg/dL — ABNORMAL HIGH (ref <1.0)
CRP: 22.4 mg/dL — ABNORMAL HIGH (ref <1.0)

## 2020-04-15 LAB — ABO/RH: ABO/RH(D): B POS

## 2020-04-15 LAB — MAGNESIUM: Magnesium: 2.5 mg/dL — ABNORMAL HIGH (ref 1.7–2.4)

## 2020-04-15 LAB — HEMOGLOBIN A1C
Hgb A1c MFr Bld: 5.5 % (ref 4.8–5.6)
Mean Plasma Glucose: 111.15 mg/dL

## 2020-04-15 LAB — MRSA PCR SCREENING: MRSA by PCR: NEGATIVE

## 2020-04-15 MED ORDER — SODIUM CHLORIDE 0.9% FLUSH: 10.0000 mL | INTRAVENOUS | Status: DC | PRN

## 2020-04-15 MED ORDER — SODIUM CHLORIDE 0.9% FLUSH
10.0000 mL | Freq: Two times a day (BID) | INTRAVENOUS | Status: DC
  Administered 2020-04-15 – 2020-04-18 (×7): 10 mL

## 2020-04-15 NOTE — Progress Notes (Signed)
Name: Anna [name] MRN: [id number] DOB: 08-26-1955    ADMISSION DATE:  03/29/2020 CONSULTATION DATE: 04/02/2020  REFERRING MD : Dr. Tamala Julian   CHIEF COMPLAINT: Shortness of Breath   BRIEF PATIENT DESCRIPTION:  64 female with ESRD on hemodialysis admitted with acute hypoxic hypercapnic respiratory failure secondary to pulmonary edema and COVID-19 requiring HFNC  SIGNIFICANT EVENTS/STUDIES:  08/31: Pt admitted to ICU requiring HFNC  08/31: CT Head revealed no acute intracranial abnormality. 04/15/20- patient is receiving HD, She does not have IV access will get midline placed.   HISTORY OF PRESENT ILLNESS:   This is a 64 yo with a PMH of Thyrotoxicosis, Morbid Obesity, Malignant HTN, Hyperparathyroidism, Hypercalcemia, Hepatitis C, ESRD, Type II Diabetes Mellitus, Cardiomyopathy, and Anemia.  She presented to [facility] ER on 08/31 from the dialysis center with O2 sats of 33% on room air.  Per ER notes she was diagnosed with COVID-19 last week and has had shortness of breath/cough.  In the ER COVID-19 positive, and CXR concerning for pulmonary edema/atypical pneumonia.  On call nephrologist contacted by ER physician, and plan is for pt to undergo hemodialysis on  04/15/2020.  Lab results revealed chloride 93, glucose 149, BUN 79, creatinine 17.93, calcium 8.3, anion gap 25, AST 60, BNP 645.4, troponin 324, pct 1.06, hgb 11.7, and pH 7.38/pCO2 43/bicarb 25.4.  She was placed 15L HFNC. She was subsequently admitted to ICU for additional workup and treatment.    PAST MEDICAL HISTORY :   has a past medical history of Anemia, AV fistula ([place]), Cardiomyopathy ([place]), Cardiomyopathy ([place]), Diabetes mellitus type 2 in obese ([place]), ESRD (end stage renal disease) ([place]), Full dentures, Hepatitis C, Hypercalcemia, Hyperparathyroidism, secondary ([place]), Malignant hypertension, Morbid obesity ([place]), and Thyrotoxicosis.  has a past surgical history that includes AV fistula placement (2013); Incisional hernia repair  (2007); AV fistula placement (2008); AV fistula placement (2008); AV fistula repair (2008); Cholecystectomy (2003); Carpal tunnel release (Right, 03/10/2014); Fistulogram (N/A, 10/26/2011); and A/V Fistulagram (N/A, 10/10/2016). Prior to Admission medications   Medication Sig Start Date End Date Taking? Authorizing Provider  amLODipine (NORVASC) 10 MG tablet Take 10 mg by mouth at bedtime. 06/02/19  Yes [provider]  AURYXIA 1 GM 210 MG(Fe) tablet Take 630 mg by mouth 3 (three) times daily with meals.  02/03/20  Yes [provider]  levothyroxine (SYNTHROID, LEVOTHROID) 50 MCG tablet Take 50 mcg by mouth daily. 08/04/16  Yes [provider]  losartan (COZAAR) 50 MG tablet Take 50 mg by mouth at bedtime. 07/04/19  Yes [provider]  ibuprofen (ADVIL,MOTRIN) 800 MG tablet Take 800 mg by mouth every 8 (eight) hours as needed for mild pain.     [provider]  meloxicam (MOBIC) 15 MG tablet Take 1 tablet (15 mg total) by mouth daily. Patient not taking: Reported on 03/19/2020 05/22/16   Victorino Dike, FNP   Allergies  Allergen Reactions  . Latex Itching    LATEX GLOVES    FAMILY HISTORY:  family history is not on file. SOCIAL HISTORY:  reports that she has quit smoking. She has never used smokeless tobacco. She reports that she does not drink alcohol and does not use drugs.  REVIEW OF SYSTEMS: Positives in BOLD  Constitutional: Negative for fever, chills, weight loss, malaise/fatigue and diaphoresis.  HENT: Negative for hearing loss, ear pain, nosebleeds, congestion, sore throat, neck pain, tinnitus and ear discharge.   Eyes: Negative for blurred vision, double vision, photophobia, pain, discharge and redness.  Respiratory:  cough, hemoptysis, sputum production, shortness of breath, wheezing and stridor.   Cardiovascular: Negative for chest pain, palpitations, orthopnea, claudication, leg swelling and PND.  Gastrointestinal: Negative for  heartburn, nausea, vomiting, abdominal pain, diarrhea, constipation, blood in stool and melena.  Genitourinary: Negative for dysuria, urgency, frequency, hematuria and flank pain.  Musculoskeletal: Negative for myalgias, back pain, joint pain and falls.  Skin: Negative for itching and rash.  Neurological: Negative for dizziness, tingling, tremors, sensory change, speech change, focal weakness, seizures, loss of consciousness, weakness and headaches.  Endo/Heme/Allergies: Negative for environmental allergies and polydipsia. Does not bruise/bleed easily.  SUBJECTIVE:  No complaints at this time  VITAL SIGNS: Temp:  [97.9 F (36.6 C)-98.7 F (37.1 C)] 98.7 F (37.1 C) (09/01 1201) Pulse Rate:  [48-91] 73 (09/01 1400) Resp:  [19-32] 28 (09/01 1400) BP: (95-165)/(58-120) 116/65 (09/01 1400) SpO2:  [85 %-93 %] 87 % (09/01 1400) FiO2 (%):  [100 %] 100 % (08/31 1755) Weight:  [79.8 kg-81.2 kg] 79.8 kg (08/31 2230)  PHYSICAL EXAMINATION: General: acutely ill appearing female, NAD on HFNC  Neuro: alert, oriented to self and place only, follows commands, PERRLA HEENT: supple, no JVD Cardiovascular: irregular irregular, no R/G  Lungs: faint crackles throughout, even, non labored  Abdomen: +BS x4, obese, soft, non tender, non distended  Musculoskeletal: 1+ bilateral lower extremity edema Skin: RUE av fistula +bruit and thrill  Recent Labs  Lab 03/26/2020 1742 04/15/20 0505  NA 140 139  K 4.2 4.5  CL 93* 95*  CO2 22 22  BUN 79* 85*  CREATININE 17.93* 18.58*  GLUCOSE 149* 197*   Recent Labs  Lab 03/20/2020 1742 04/15/20 0505  HGB 11.7* 11.9*  HCT 35.6* 34.6*  WBC 4.2 3.5*  PLT 210 187   DG Chest 1 View  Result Date: 04/07/2020 CLINICAL DATA:  Shortness of breath.  Dialysis patient. EXAM: CHEST  1 VIEW COMPARISON:  07/31/2019 FINDINGS: Extensive support apparatus artifact projecting over the right apex. Patient rotated minimally right. Moderate to marked enlargement of the  cardiopericardial silhouette. Suspect small bilateral pleural effusions. No pneumothorax. Relatively diffuse interstitial and airspace disease, with relative sparing of the apices. IMPRESSION: Cardiomegaly with diffuse interstitial and airspace disease. Given clinical history, most likely due to pulmonary edema. Atypical infection or aspiration could look similar. Suspicion of layering bilateral pleural effusions. Electronically Signed   By: Abigail Miyamoto M.D.   On: 03/19/2020 18:13   CT HEAD WO CONTRAST  Result Date: 03/21/2020 CLINICAL DATA:  Mental status change EXAM: CT HEAD WITHOUT CONTRAST TECHNIQUE: Contiguous axial images were obtained from the base of the skull through the vertex without intravenous contrast. COMPARISON:  None. FINDINGS: Brain: No evidence of acute territorial infarction, hemorrhage, hydrocephalus,extra-axial collection or mass lesion/mass effect. Normal gray-white differentiation. Ventricles are normal in size and contour. Vascular: No hyperdense vessel or unexpected calcification. Skull: The skull is intact. No fracture or focal lesion identified. Sinuses/Orbits: The visualized paranasal sinuses and mastoid air cells are clear. The orbits and globes intact. Other: None IMPRESSION: No acute intracranial abnormality. Electronically Signed   By: Prudencio Pair M.D.   On: 04/04/2020 21:38   Portable chest 1 View  Result Date: 04/15/2020 CLINICAL DATA:  Shortness of breath.  COVID positive. EXAM: PORTABLE CHEST 1 VIEW COMPARISON:  04/02/2020 FINDINGS: 0448 hours. Diffuse bilateral airspace disease again noted, right greater than left and mildly progressive in the interval. The cardio pericardial silhouette is enlarged. The visualized bony structures of the thorax show now acute abnormality. Telemetry leads  overlie the chest. IMPRESSION: Interval progression of diffuse asymmetric bilateral airspace disease, right greater than left, now appearing more confluent. No definite evidence of  pleural effusion. Electronically Signed   By: Misty Stanley M.D.   On: 04/15/2020 04:59    ASSESSMENT / PLAN:  Acute hypoxic hypercapnic respiratory failure secondary to pulmonary edema in the setting of ESRD and COVID-19 Hx: Morbid obesity  Prn supplemental O2 for dyspnea and/or hypoxia  Continue remdesivir, vitamins, and iv steroids Trend inflammatory markers  Prn bronchodilator therapy Self-proning as tolerated  Encourage OOB to chair  Aggressive pulmonary hygiene  Malignant HTN Elevated troponin likely secondary to demand ischemia in the setting of acute respiratory failure  Hx: Cardiomyopathy Continuous telemetry monitoring  Continue outpatient antihypertensives  Trend troponin's   ESRD on hemodialysis  Trend BMP  Replace electrolytes as indicated  Avoid nephrotoxic medications   Anemia without obvious signs of acute blood loss VTE px: subq heparin and SCD's  Trend CBC  Monitor s/sx of bleeding and transfuse hgb <7  Type II diabetes mellitus  CBG's ac/hs SSI   Acute encephalopathy likely secondary to infectious process  Frequent reorientation  Avoid sedating medications    Critical care provider statement:    Critical care time (minutes):  34   Critical care time was exclusive of:  Separately billable procedures and  treating other patients   Critical care was necessary to treat or prevent imminent or  life-threatening deterioration of the following conditions:  Acute hypoxemic respiratory failure, COVID19 pneumonia, ESRD, encephalopathy.   Critical care was time spent personally by me on the following  activities:  Development of treatment plan with patient or surrogate,  discussions with consultants, evaluation of patient's response to  treatment, examination of patient, obtaining history from patient or  surrogate, ordering and performing treatments and interventions, ordering  and review of laboratory studies and re-evaluation of patient's condition   I  assumed direction of critical care for this patient from another  provider in my specialty: no      Ottie Glazier, M.D.  Pulmonary & [place]

## 2020-04-15 NOTE — Progress Notes (Signed)
[facility]  ROUNDING NOTE   Subjective:   Anna [name] was admitted to [facility] on 03/18/2020 for Shortness of breath [R06.02] Uremia [N19] Acute and chronic respiratory failure (acute-on-chronic) (HCC) [J96.20] ESRD (end stage renal disease) ([place]) [N18.6] Acute respiratory failure with hypoxia (HCC) [J96.01] Troponin I above reference range [R77.8] High anion gap metabolic acidosis [Z61.0] COVID-19 [U07.1]  Last hemodialysis treatment was 8/26. She missed her last two treatments. Presented to the dialysis clinic yesterday with hypoxia and shortness of breath. Patient was diagnosed with COVID-19 last week.   Objective:  Vital signs in last 24 hours:  Temp:  [97.9 F (36.6 C)-98.7 F (37.1 C)] 98.7 F (37.1 C) (09/01 1201) Pulse Rate:  [52-91] 67 (09/01 0900) Resp:  [19-32] 25 (09/01 1000) BP: (112-165)/(58-120) 129/72 (09/01 1201) SpO2:  [85 %-92 %] 85 % (09/01 1201) FiO2 (%):  [100 %] 100 % (08/31 1755) Weight:  [79.8 kg-81.2 kg] 79.8 kg (08/31 2230)  Weight change:  Filed Weights   03/30/2020 1739 04/07/2020 2230  Weight: 81.2 kg 79.8 kg    Intake/Output: I/O last 3 completed shifts: In: 250 [IV Piggyback:250] Out: -    Intake/Output this shift:  Total I/O In: 37.5 [IV Piggyback:37.5] Out: -   Physical Exam: General: Critically ill  Head: Normocephalic, atraumatic. Moist oral mucosal membranes  Eyes: Anicteric, PERRL  Neck: Supple, trachea midline  Lungs:  Bilateral crackles HFNC 15L O2  Heart: Regular rate and rhythm  Abdomen:  Soft,   Extremities:  + peripheral edema.  Neurologic: Able to answer yes and no questions, following commands.   Skin: No lesions  Access: Right AVF    Basic Metabolic Panel: Recent Labs  Lab 04/13/2020 1742 04/15/20 0505  NA 140 139  K 4.2 4.5  CL 93* 95*  CO2 22 22  GLUCOSE 149* 197*  BUN 79* 85*  CREATININE 17.93* 18.58*  CALCIUM 8.3* 8.2*  MG 2.3 2.5*  PHOS  --  8.8*    Liver Function Tests: Recent  Labs  Lab 04/04/2020 1742 04/15/20 0505  AST 60* 48*  ALT 18 15  ALKPHOS 65 59  BILITOT 1.3* 1.6*  PROT 6.9 6.6  ALBUMIN 3.1* 2.9*   No results for input(s): LIPASE, AMYLASE in the last 168 hours. No results for input(s): AMMONIA in the last 168 hours.  CBC: Recent Labs  Lab 04/11/2020 1742 04/15/20 0505  WBC 4.2 3.5*  NEUTROABS 3.4 2.9  HGB 11.7* 11.9*  HCT 35.6* 34.6*  MCV 87.5 85.4  PLT 210 187    Cardiac Enzymes: No results for input(s): CKTOTAL, CKMB, CKMBINDEX, TROPONINI in the last 168 hours.  BNP: Invalid input(s): POCBNP  CBG: Recent Labs  Lab 04/10/2020 2221 04/15/20 0738 04/15/20 1125  GLUCAP 139* 183* 175*    Microbiology: Results for orders placed or performed during the hospital encounter of 04/04/2020  SARS Coronavirus 2 by RT PCR (hospital order, performed in [facility] hospital lab) Nasopharyngeal Nasopharyngeal Swab     Status: Abnormal   Collection Time: 04/04/2020  5:42 PM   Specimen: Nasopharyngeal Swab  Result Value Ref Range Status   SARS Coronavirus 2 POSITIVE (A) NEGATIVE Final    Comment: RESULT CALLED TO, READ BACK BY AND VERIFIED WITH: MAC BROWN 04/09/2020 AT 1935 BY AR (NOTE) SARS-CoV-2 target nucleic acids are DETECTED  SARS-CoV-2 RNA is generally detectable in upper respiratory specimens  during the acute phase of infection.  Positive results are indicative  of the presence of the identified virus,  but do not rule out bacterial infection or co-infection with other pathogens not detected by the test.  Clinical correlation with patient history and  other diagnostic information is necessary to determine patient infection status.  The expected result is negative.  Fact Sheet for Patients:   StrictlyIdeas.no   Fact Sheet for Healthcare Providers:   BankingDealers.co.za    This test is not yet approved or cleared by the [place] FDA and  has been authorized for detection and/or  diagnosis of SARS-CoV-2 by FDA under an Emergency Use Authorization (EUA).  This EUA will remain in effect (meaning this test  can be used) for the duration of  the COVID-19 declaration under Section 564(b)(1) of the Act, 21 U.S.C. section 360-bbb-3(b)(1), unless the authorization is terminated or revoked sooner.  Performed at [facility], [address] [place], [place] [address]   Culture, blood (Routine X 2) w Reflex to ID Panel     Status: None (Preliminary result)   Collection Time: 04/13/2020  9:12 PM   Specimen: BLOOD  Result Value Ref Range Status   Specimen Description BLOOD LEFT AC  Final   Special Requests   Final    BOTTLES DRAWN AEROBIC AND ANAEROBIC Blood Culture adequate volume   Culture   Final    NO GROWTH < 12 HOURS Performed at [facility], [address], [place], [place] [address]    Report Status PENDING  Incomplete  Culture, blood (Routine X 2) w Reflex to ID Panel     Status: None (Preliminary result)   Collection Time: 04/11/2020  9:12 PM   Specimen: BLOOD  Result Value Ref Range Status   Specimen Description BLOOD LEFT AC  Final   Special Requests   Final    BOTTLES DRAWN AEROBIC AND ANAEROBIC Blood Culture adequate volume   Culture   Final    NO GROWTH < 12 HOURS Performed at [facility] [address] [place], [place] [address]    Report Status PENDING  Incomplete  MRSA PCR Screening     Status: None   Collection Time: 04/15/20 12:15 AM   Specimen: Nasal Mucosa; Nasopharyngeal  Result Value Ref Range Status   MRSA by PCR NEGATIVE NEGATIVE Final    Comment:        The GeneXpert MRSA Assay (FDA approved for NASAL specimens only), is one component of a comprehensive MRSA colonization surveillance program. It is not intended to diagnose MRSA infection nor to guide or monitor treatment for MRSA infections. Performed at [facility], [address] [place], [place] [address]     Coagulation  Studies: No results for input(s): LABPROT, INR in the last 72 hours.  Urinalysis: No results for input(s): COLORURINE, LABSPEC, PHURINE, GLUCOSEU, HGBUR, BILIRUBINUR, KETONESUR, PROTEINUR, UROBILINOGEN, NITRITE, LEUKOCYTESUR in the last 72 hours.  Invalid input(s): APPERANCEUR    Imaging: DG Chest 1 View  Result Date: 03/25/2020 CLINICAL DATA:  Shortness of breath.  Dialysis patient. EXAM: CHEST  1 VIEW COMPARISON:  07/31/2019 FINDINGS: Extensive support apparatus artifact projecting over the right apex. Patient rotated minimally right. Moderate to marked enlargement of the cardiopericardial silhouette. Suspect small bilateral pleural effusions. No pneumothorax. Relatively diffuse interstitial and airspace disease, with relative sparing of the apices. IMPRESSION: Cardiomegaly with diffuse interstitial and airspace disease. Given clinical history, most likely due to pulmonary edema. Atypical infection or aspiration could look similar. Suspicion of layering bilateral pleural effusions. Electronically Signed   By: Abigail Miyamoto M.D.   On:  03/23/2020 18:13   CT HEAD WO CONTRAST  Result Date: 03/25/2020 CLINICAL DATA:  Mental status change EXAM: CT HEAD WITHOUT CONTRAST TECHNIQUE: Contiguous axial images were obtained from the base of the skull through the vertex without intravenous contrast. COMPARISON:  None. FINDINGS: Brain: No evidence of acute territorial infarction, hemorrhage, hydrocephalus,extra-axial collection or mass lesion/mass effect. Normal gray-white differentiation. Ventricles are normal in size and contour. Vascular: No hyperdense vessel or unexpected calcification. Skull: The skull is intact. No fracture or focal lesion identified. Sinuses/Orbits: The visualized paranasal sinuses and mastoid air cells are clear. The orbits and globes intact. Other: None IMPRESSION: No acute intracranial abnormality. Electronically Signed   By: Prudencio Pair M.D.   On: 04/04/2020 21:38   Portable chest 1  View  Result Date: 04/15/2020 CLINICAL DATA:  Shortness of breath.  COVID positive. EXAM: PORTABLE CHEST 1 VIEW COMPARISON:  04/07/2020 FINDINGS: 0448 hours. Diffuse bilateral airspace disease again noted, right greater than left and mildly progressive in the interval. The cardio pericardial silhouette is enlarged. The visualized bony structures of the thorax show now acute abnormality. Telemetry leads overlie the chest. IMPRESSION: Interval progression of diffuse asymmetric bilateral airspace disease, right greater than left, now appearing more confluent. No definite evidence of pleural effusion. Electronically Signed   By: Misty Stanley M.D.   On: 04/15/2020 04:59     Medications:   . remdesivir 100 mg in NS 100 mL Stopped (04/15/20 0958)   . amLODipine  10 mg Oral QHS  . vitamin C  500 mg Oral Daily  . Chlorhexidine Gluconate Cloth  6 each Topical Daily  . ferric citrate  630 mg Oral TID WC  . folic acid  1 mg Intravenous Daily  . heparin  5,000 Units Subcutaneous Q8H  . insulin aspart  0-5 Units Subcutaneous QHS  . insulin aspart  0-6 Units Subcutaneous TID WC  . levothyroxine  50 mcg Oral Daily  . losartan  50 mg Oral QHS  . methylPREDNISolone (SOLU-MEDROL) injection  40 mg Intravenous Q12H  . thiamine injection  100 mg Intravenous Daily  . zinc sulfate  220 mg Oral Daily   acetaminophen, albuterol, guaiFENesin-dextromethorphan, ondansetron **OR** ondansetron (ZOFRAN) IV  Assessment/ Plan:  Anna [name] is a 64 y.o. black female with end stage renal disease on hemodialysis for last 23 years, hypertension, diabetes mellitus type II, hypothyroidism, who was admitted to [facility] on 03/25/2020 for Shortness of breath [R06.02] Uremia [N19] Acute and chronic respiratory failure (acute-on-chronic) (HCC) [J96.20] ESRD (end stage renal disease) ([place]) [N18.6] Acute respiratory failure with hypoxia ([place]) [J96.01] Troponin I above reference range [R77.8] High anion gap metabolic acidosis  [H47.6] COVID-19 [U07.1]  [facility] ([place]) TTS [facility]. Right AVF 81.5kg  1. End Stage Renal Disease: with volume overload and respiratory failure requiring high flow oxgyen. Last hemodialysis treatment was 8/26.  - Hemodialysis treatment today. Orders prepared.   2. Hypertension: home regimen of losartan and amlodipine  3. Anemia of chronic kidney disease: Hemoglobin 11.9. No indication for EPO during today's treatment. Mircera as outpatient.   4. Secondary Hyperparathyroidism: with hyperphosphatemia.  Lorin Picket with meals.     LOS: 1 Anna [name] 9/1/202112:08 PM

## 2020-04-15 DEATH — deceased

## 2020-04-16 DIAGNOSIS — U071 COVID-19: Secondary | ICD-10-CM

## 2020-04-16 DIAGNOSIS — N185 Chronic kidney disease, stage 5: Secondary | ICD-10-CM

## 2020-04-16 LAB — COMPREHENSIVE METABOLIC PANEL
ALT: 16 U/L (ref 0–44)
AST: 37 U/L (ref 15–41)
Albumin: 2.6 g/dL — ABNORMAL LOW (ref 3.5–5.0)
Alkaline Phosphatase: 59 U/L (ref 38–126)
Anion gap: 26 — ABNORMAL HIGH (ref 5–15)
BUN: 54 mg/dL — ABNORMAL HIGH (ref 8–23)
CO2: 21 mmol/L — ABNORMAL LOW (ref 22–32)
Calcium: 8.6 mg/dL — ABNORMAL LOW (ref 8.9–10.3)
Chloride: 93 mmol/L — ABNORMAL LOW (ref 98–111)
Creatinine, Ser: 11.74 mg/dL — ABNORMAL HIGH (ref 0.44–1.00)
GFR calc Af Amer: 4 mL/min — ABNORMAL LOW (ref >60)
GFR calc non Af Amer: 3 mL/min — ABNORMAL LOW (ref >60)
Glucose, Bld: 180 mg/dL — ABNORMAL HIGH (ref 70–99)
Potassium: 4.4 mmol/L (ref 3.5–5.1)
Sodium: 140 mmol/L (ref 135–145)
Total Bilirubin: 2.5 mg/dL — ABNORMAL HIGH (ref 0.3–1.2)
Total Protein: 6.1 g/dL — ABNORMAL LOW (ref 6.5–8.1)

## 2020-04-16 LAB — BLOOD GAS, ARTERIAL
Acid-base deficit: 1.1 mmol/L (ref 0.0–2.0)
Bicarbonate: 22 mmol/L (ref 20.0–28.0)
FIO2: 1
O2 Saturation: 91.7 %
Patient temperature: 37
pCO2 arterial: 31 mmHg — ABNORMAL LOW (ref 32.0–48.0)
pH, Arterial: 7.46 — ABNORMAL HIGH (ref 7.350–7.450)
pO2, Arterial: 59 mmHg — ABNORMAL LOW (ref 83.0–108.0)

## 2020-04-16 LAB — CBC WITH DIFFERENTIAL/PLATELET
Abs Immature Granulocytes: 0.13 10*3/uL — ABNORMAL HIGH (ref 0.00–0.07)
Basophils Absolute: 0 10*3/uL (ref 0.0–0.1)
Basophils Relative: 0 %
Eosinophils Absolute: 0 10*3/uL (ref 0.0–0.5)
Eosinophils Relative: 0 %
HCT: 35.5 % — ABNORMAL LOW (ref 36.0–46.0)
Hemoglobin: 11.7 g/dL — ABNORMAL LOW (ref 12.0–15.0)
Immature Granulocytes: 2 %
Lymphocytes Relative: 7 %
Lymphs Abs: 0.4 10*3/uL — ABNORMAL LOW (ref 0.7–4.0)
MCH: 29.1 pg (ref 26.0–34.0)
MCHC: 33 g/dL (ref 30.0–36.0)
MCV: 88.3 fL (ref 80.0–100.0)
Monocytes Absolute: 0.4 10*3/uL (ref 0.1–1.0)
Monocytes Relative: 6 %
Neutro Abs: 5.2 10*3/uL (ref 1.7–7.7)
Neutrophils Relative %: 85 %
Platelets: 260 10*3/uL (ref 150–400)
RBC: 4.02 MIL/uL (ref 3.87–5.11)
RDW: 15.3 % (ref 11.5–15.5)
WBC: 6.1 10*3/uL (ref 4.0–10.5)
nRBC: 0.7 % — ABNORMAL HIGH (ref 0.0–0.2)

## 2020-04-16 LAB — FERRITIN: Ferritin: >7500 ng/mL — ABNORMAL HIGH (ref 11–307)

## 2020-04-16 LAB — GLUCOSE, CAPILLARY
Glucose-Capillary: 181 mg/dL — ABNORMAL HIGH (ref 70–99)
Glucose-Capillary: 191 mg/dL — ABNORMAL HIGH (ref 70–99)
Glucose-Capillary: 203 mg/dL — ABNORMAL HIGH (ref 70–99)
Glucose-Capillary: 232 mg/dL — ABNORMAL HIGH (ref 70–99)
Glucose-Capillary: 234 mg/dL — ABNORMAL HIGH (ref 70–99)

## 2020-04-16 LAB — FIBRIN DERIVATIVES D-DIMER (ARMC ONLY): Fibrin derivatives D-dimer (ARMC): 851.1 ng/mL (FEU) — ABNORMAL HIGH (ref 0.00–499.00)

## 2020-04-16 LAB — C-REACTIVE PROTEIN: CRP: 19.2 mg/dL — ABNORMAL HIGH (ref <1.0)

## 2020-04-16 LAB — MAGNESIUM: Magnesium: 2.2 mg/dL (ref 1.7–2.4)

## 2020-04-16 LAB — PHOSPHORUS: Phosphorus: 7.5 mg/dL — ABNORMAL HIGH (ref 2.5–4.6)

## 2020-04-16 MED ORDER — PANTOPRAZOLE SODIUM 40 MG IV SOLR
40.0000 mg | INTRAVENOUS | Status: DC
  Administered 2020-04-16 – 2020-04-18 (×3): 40 mg via INTRAVENOUS
  Filled 2020-04-16 (×3): qty 40

## 2020-04-16 MED ORDER — NOREPINEPHRINE 4 MG/250ML-% IV SOLN
INTRAVENOUS | Status: AC
  Filled 2020-04-16: qty 250

## 2020-04-16 MED ORDER — DEXMEDETOMIDINE HCL IN NACL 400 MCG/100ML IV SOLN
0.4000 ug/kg/h | INTRAVENOUS | Status: DC
  Administered 2020-04-16: 0.5 ug/kg/h via INTRAVENOUS
  Administered 2020-04-17: 1.2 ug/kg/h via INTRAVENOUS
  Administered 2020-04-17: 0.5 ug/kg/h via INTRAVENOUS
  Administered 2020-04-18 (×3): 1.2 ug/kg/h via INTRAVENOUS
  Administered 2020-04-18 (×2): 0.7 ug/kg/h via INTRAVENOUS
  Filled 2020-04-16 (×8): qty 100

## 2020-04-16 MED ORDER — NOREPINEPHRINE 4 MG/250ML-% IV SOLN: 2.0000 ug/min | INTRAVENOUS | Status: DC

## 2020-04-16 MED ORDER — NOREPINEPHRINE 4 MG/250ML-% IV SOLN
2.0000 ug/min | INTRAVENOUS | Status: DC
  Administered 2020-04-16: 10 ug/min via INTRAVENOUS
  Administered 2020-04-16: 15 ug/min via INTRAVENOUS
  Administered 2020-04-17: 5 ug/min via INTRAVENOUS
  Filled 2020-04-16 (×3): qty 250

## 2020-04-16 MED ORDER — SODIUM CHLORIDE 0.9 % IV SOLN: 250.0000 mL | INTRAVENOUS | Status: DC

## 2020-04-16 MED ORDER — NOREPINEPHRINE 4 MG/250ML-% IV SOLN
2.0000 ug/min | INTRAVENOUS | Status: DC
  Administered 2020-04-16: 2 ug/min via INTRAVENOUS

## 2020-04-16 NOTE — Progress Notes (Signed)
Pt Alert to person, situation and place throughout shift. Very lethargic and more confused at times. Pt pulled off Bubble HF and NRB twice, O2 dropped into the 50's by the time I could get in the room. Once Oxygen was put back on pt went back up into the high 80's low 90's. Had to start Levo due to MAPs below 60. Pt is NPO due to altered mental status. Temporary Right Femoral Catheter inserted by Dr. Delana Meyer for possible CRRT and pressors. Pt still on covid precautions and daughter updated and gave consent for cath placement. Will continue to monitor.

## 2020-04-16 NOTE — Progress Notes (Signed)
 Vein & Vascular Surgery  Due to high volume / critical patients in ICU, our team was asked to place temp dialysis cathether as the patient is critically ill and needs dialysis emergently.   Will place temp dialysis catheter.  Marcelle Overlie PA-C 04/16/2020 5:02 PM

## 2020-04-16 NOTE — Progress Notes (Signed)
Name: Anna [name] MRN: [id number] DOB: 1956-04-12    ADMISSION DATE:  04/13/2020 CONSULTATION DATE: 04/12/2020  REFERRING MD : Dr. Tamala Julian   CHIEF COMPLAINT: Shortness of Breath   BRIEF PATIENT DESCRIPTION:  64 female with ESRD on hemodialysis admitted with acute hypoxic hypercapnic respiratory failure secondary to pulmonary edema and COVID-19 requiring HFNC  SIGNIFICANT EVENTS/STUDIES:  08/31: Pt admitted to ICU requiring HFNC  08/31: CT Head revealed no acute intracranial abnormality. 04/15/20- patient is receiving HD, She does not have IV access will get midline placed.  04/16/20- patient had uneventful day. Plan for trialysis line placement as per renal team since they do not have staff to cannulate her AV fistula.  Nephrologist asked vascular team to place catheter.  HISTORY OF PRESENT ILLNESS:   This is a 64 yo with a PMH of Thyrotoxicosis, Morbid Obesity, Malignant HTN, Hyperparathyroidism, Hypercalcemia, Hepatitis C, ESRD, Type II Diabetes Mellitus, Cardiomyopathy, and Anemia.  She presented to [facility] ER on 08/31 from the dialysis center with O2 sats of 33% on room air.  Per ER notes she was diagnosed with COVID-19 last week and has had shortness of breath/cough.  In the ER COVID-19 positive, and CXR concerning for pulmonary edema/atypical pneumonia.  On call nephrologist contacted by ER physician, and plan is for pt to undergo hemodialysis on  04/15/2020.  Lab results revealed chloride 93, glucose 149, BUN 79, creatinine 17.93, calcium 8.3, anion gap 25, AST 60, BNP 645.4, troponin 324, pct 1.06, hgb 11.7, and pH 7.38/pCO2 43/bicarb 25.4.  She was placed 15L HFNC. She was subsequently admitted to ICU for additional workup and treatment.    PAST MEDICAL HISTORY :   has a past medical history of Anemia, AV fistula ([place]), Cardiomyopathy ([place]), Cardiomyopathy ([place]), Diabetes mellitus type 2 in obese ([place]), ESRD (end stage renal disease) ([place]), Full dentures, Hepatitis C, Hypercalcemia,  Hyperparathyroidism, secondary ([place]), Malignant hypertension, Morbid obesity ([place]), and Thyrotoxicosis.  has a past surgical history that includes AV fistula placement (2013); Incisional hernia repair (2007); AV fistula placement (2008); AV fistula placement (2008); AV fistula repair (2008); Cholecystectomy (2003); Carpal tunnel release (Right, 03/10/2014); Fistulogram (N/A, 10/26/2011); and A/V Fistulagram (N/A, 10/10/2016). Prior to Admission medications   Medication Sig Start Date End Date Taking? Authorizing Provider  amLODipine (NORVASC) 10 MG tablet Take 10 mg by mouth at bedtime. 06/02/19  Yes [provider]  AURYXIA 1 GM 210 MG(Fe) tablet Take 630 mg by mouth 3 (three) times daily with meals.  02/03/20  Yes [provider]  levothyroxine (SYNTHROID, LEVOTHROID) 50 MCG tablet Take 50 mcg by mouth daily. 08/04/16  Yes [provider]  losartan (COZAAR) 50 MG tablet Take 50 mg by mouth at bedtime. 07/04/19  Yes [provider]  ibuprofen (ADVIL,MOTRIN) 800 MG tablet Take 800 mg by mouth every 8 (eight) hours as needed for mild pain.     [provider]  meloxicam (MOBIC) 15 MG tablet Take 1 tablet (15 mg total) by mouth daily. Patient not taking: Reported on 03/27/2020 05/22/16   Victorino Dike, FNP   Allergies  Allergen Reactions  . Latex Itching    LATEX GLOVES    FAMILY HISTORY:  family history is not on file. SOCIAL HISTORY:  reports that she has quit smoking. She has never used smokeless tobacco. She reports that she does not drink alcohol and does not use drugs.  REVIEW OF SYSTEMS: Positives in BOLD  Constitutional: Negative for fever, chills, weight loss, malaise/fatigue and diaphoresis.  HENT: Negative for hearing loss, ear pain, nosebleeds, congestion, sore throat, neck pain, tinnitus and ear discharge.   Eyes: Negative for blurred vision, double vision, photophobia, pain, discharge and redness.  Respiratory: cough, hemoptysis, sputum  production, shortness of breath, wheezing and stridor.   Cardiovascular: Negative for chest pain, palpitations, orthopnea, claudication, leg swelling and PND.  Gastrointestinal: Negative for heartburn, nausea, vomiting, abdominal pain, diarrhea, constipation, blood in stool and melena.  Genitourinary: Negative for dysuria, urgency, frequency, hematuria and flank pain.  Musculoskeletal: Negative for myalgias, back pain, joint pain and falls.  Skin: Negative for itching and rash.  Neurological: Negative for dizziness, tingling, tremors, sensory change, speech change, focal weakness, seizures, loss of consciousness, weakness and headaches.  Endo/Heme/Allergies: Negative for environmental allergies and polydipsia. Does not bruise/bleed easily.  SUBJECTIVE:  No complaints at this time  VITAL SIGNS: Temp:  [97.3 F (36.3 C)-98.1 F (36.7 C)] 98 F (36.7 C) (09/02 1200) Pulse Rate:  [41-92] 48 (09/02 1600) Resp:  [20-32] 30 (09/02 1600) BP: (66-139)/(22-111) 125/97 (09/02 1600) SpO2:  [80 %-97 %] 90 % (09/02 1610)  PHYSICAL EXAMINATION: General: acutely ill appearing female, NAD on HFNC  Neuro: alert, oriented to self and place only, follows commands, PERRLA HEENT: supple, no JVD Cardiovascular: irregular irregular, no R/G  Lungs: faint crackles throughout, even, non labored  Abdomen: +BS x4, obese, soft, non tender, non distended  Musculoskeletal: 1+ bilateral lower extremity edema Skin: RUE av fistula +bruit and thrill  Recent Labs  Lab 03/29/2020 1742 04/15/20 0505 04/16/20 0335  NA 140 139 140  K 4.2 4.5 4.4  CL 93* 95* 93*  CO2 22 22 21*  BUN 79* 85* 54*  CREATININE 17.93* 18.58* 11.74*  GLUCOSE 149* 197* 180*   Recent Labs  Lab 03/21/2020 1742 04/15/20 0505 04/16/20 0335  HGB 11.7* 11.9* 11.7*  HCT 35.6* 34.6* 35.5*  WBC 4.2 3.5* 6.1  PLT 210 187 260   CT HEAD WO CONTRAST  Result Date: 03/15/2020 CLINICAL DATA:  Mental status change EXAM: CT HEAD WITHOUT CONTRAST  TECHNIQUE: Contiguous axial images were obtained from the base of the skull through the vertex without intravenous contrast. COMPARISON:  None. FINDINGS: Brain: No evidence of acute territorial infarction, hemorrhage, hydrocephalus,extra-axial collection or mass lesion/mass effect. Normal gray-white differentiation. Ventricles are normal in size and contour. Vascular: No hyperdense vessel or unexpected calcification. Skull: The skull is intact. No fracture or focal lesion identified. Sinuses/Orbits: The visualized paranasal sinuses and mastoid air cells are clear. The orbits and globes intact. Other: None IMPRESSION: No acute intracranial abnormality. Electronically Signed   By: Prudencio Pair M.D.   On: 03/21/2020 21:38   Portable chest 1 View  Result Date: 04/15/2020 CLINICAL DATA:  Shortness of breath.  COVID positive. EXAM: PORTABLE CHEST 1 VIEW COMPARISON:  04/12/2020 FINDINGS: 0448 hours. Diffuse bilateral airspace disease again noted, right greater than left and mildly progressive in the interval. The cardio pericardial silhouette is enlarged. The visualized bony structures of the thorax show now acute abnormality. Telemetry leads overlie the chest. IMPRESSION: Interval progression of diffuse asymmetric bilateral airspace disease, right greater than left, now appearing more confluent. No definite evidence of pleural effusion. Electronically Signed   By: Misty Stanley M.D.   On: 04/15/2020 04:59    ASSESSMENT / PLAN:  Acute hypoxic hypercapnic respiratory failure secondary to pulmonary edema in the setting of ESRD and COVID-19 Hx: Morbid obesity  Prn supplemental O2 for dyspnea and/or hypoxia  Continue remdesivir, vitamins, and iv steroids Trend  inflammatory markers  Prn bronchodilator therapy Self-proning as tolerated  Encourage OOB to chair  Aggressive pulmonary hygiene  Malignant HTN Elevated troponin likely secondary to demand ischemia in the setting of acute respiratory failure  Hx:  Cardiomyopathy Continuous telemetry monitoring  Continue outpatient antihypertensives  Trend troponin's   ESRD on hemodialysis  Trend BMP  Replace electrolytes as indicated  Avoid nephrotoxic medications   Anemia without obvious signs of acute blood loss VTE px: subq heparin and SCD's  Trend CBC  Monitor s/sx of bleeding and transfuse hgb <7  Type II diabetes mellitus  CBG's ac/hs SSI   Acute encephalopathy likely secondary to infectious process  Frequent reorientation  Avoid sedating medications    Critical care provider statement:    Critical care time (minutes):  34   Critical care time was exclusive of:  Separately billable procedures and  treating other patients   Critical care was necessary to treat or prevent imminent or  life-threatening deterioration of the following conditions:  Acute hypoxemic respiratory failure, COVID19 pneumonia, ESRD, encephalopathy.   Critical care was time spent personally by me on the following  activities:  Development of treatment plan with patient or surrogate,  discussions with consultants, evaluation of patient's response to  treatment, examination of patient, obtaining history from patient or  surrogate, ordering and performing treatments and interventions, ordering  and review of laboratory studies and re-evaluation of patient's condition   I assumed direction of critical care for this patient from another  provider in my specialty: no      Ottie Glazier, M.D.  [facility] [place]

## 2020-04-16 NOTE — Progress Notes (Signed)
Pt had to be started on levo. Per MD modified Levo order from 2-71mcg to 2-89mcg. Being infused in pts midline. Will continue to monitor.

## 2020-04-16 NOTE — Op Note (Signed)
  OPERATIVE NOTE   PROCEDURE: 1. Insertion of temporary dialysis catheter catheter right common femoral approach.  PRE-OPERATIVE DIAGNOSIS: Multisystem organ dysfunction secondary to COVID-19 requiring CRRT  POST-OPERATIVE DIAGNOSIS: Same  SURGEON: Katha Cabal M.D.  ANESTHESIA: 1% lidocaine local infiltration  ESTIMATED BLOOD LOSS: Minimal cc  INDICATIONS:   Anna [name] is a 64 y.o. female who presents with COVID-19 infection.  Her condition is deteriorated.  She is chronically on hemodialysis but the feeling from the nephrologist is she will need CRRT and therefore a temporary catheter is being placed.  Risk and benefits were reviewed with the patient's daughter all questions answered patient's daughter wishes for [place] to proceed.  DESCRIPTION: After obtaining full informed written consent, the patient was positioned supine. The right groin was prepped and draped in a sterile fashion. Ultrasound was placed in a sterile sleeve. Ultrasound was utilized to identify the right common femoral vein which is noted to be echolucent and compressible indicating patency. Images recorded for the permanent record. Under real-time visualization a Seldinger needle is inserted into the vein and the guidewires advanced without difficulty. Small counterincision was made at the wire insertion site. Dilator is passed over the wire and the temporary dialysis catheter catheter is fed over the wire without difficulty.  All lumens aspirate and flush easily and are packed with heparin saline. Catheter secured to the skin of the right thigh with 2-0 silk. A sterile dressing is applied with Biopatch.  COMPLICATIONS: None  CONDITION: Unchanged  [name] Office:  [phone] 04/16/2020, 6:48 PM

## 2020-04-16 NOTE — Progress Notes (Signed)
[facility]  ROUNDING NOTE   Subjective:   Intermittent hemodialysis treatment yesterday. UF of 1.5 liters.   Patient with hypotension this morning.   Objective:  Vital signs in last 24 hours:  Temp:  [97.3 F (36.3 C)-98.7 F (37.1 C)] 98.1 F (36.7 C) (09/02 0400) Pulse Rate:  [48-92] 92 (09/02 0400) Resp:  [20-32] 21 (09/02 0700) BP: (81-139)/(33-111) 121/58 (09/02 0700) SpO2:  [84 %-97 %] 92 % (09/02 0400)  Weight change:  Filed Weights   04/09/2020 1739 04/02/2020 2230  Weight: 81.2 kg 79.8 kg    Intake/Output: I/O last 3 completed shifts: In: 414.5 [IV Piggyback:414.5] Out: 1500 [Other:1500]   Intake/Output this shift:  No intake/output data recorded.  Physical Exam: General: Critically ill  Head: Normocephalic, atraumatic. Moist oral mucosal membranes  Eyes: Anicteric, PERRL  Neck: Supple, trachea midline  Lungs:  Bilateral crackles HFNC 15L O2  Heart: Regular rate and rhythm  Abdomen:  Soft,   Extremities:  + peripheral edema.  Neurologic: Able to answer yes and no questions, following commands.   Skin: No lesions  Access: Right AVF    Basic Metabolic Panel: Recent Labs  Lab 03/19/2020 1742 04/15/20 0505 04/16/20 0335  NA 140 139 140  K 4.2 4.5 4.4  CL 93* 95* 93*  CO2 22 22 21*  GLUCOSE 149* 197* 180*  BUN 79* 85* 54*  CREATININE 17.93* 18.58* 11.74*  CALCIUM 8.3* 8.2* 8.6*  MG 2.3 2.5* 2.2  PHOS  --  8.8* 7.5*    Liver Function Tests: Recent Labs  Lab 03/18/2020 1742 04/15/20 0505 04/16/20 0335  AST 60* 48* 37  ALT _0 ALKPHOS 65 59 59  BILITOT 1.3* 1.6* 2.5*  PROT 6.9 6.6 6.1*  ALBUMIN 3.1* 2.9* 2.6*   No results for input(s): LIPASE, AMYLASE in the last 168 hours. No results for input(s): AMMONIA in the last 168 hours.  CBC: Recent Labs  Lab 04/03/2020 1742 04/15/20 0505 04/16/20 0335  WBC 4.2 3.5* 6.1  NEUTROABS 3.4 2.9 5.2  HGB 11.7* 11.9* 11.7*  HCT 35.6* 34.6* 35.5*  MCV 87.5 85.4 88.3  PLT 210 187  260    Cardiac Enzymes: No results for input(s): CKTOTAL, CKMB, CKMBINDEX, TROPONINI in the last 168 hours.  BNP: Invalid input(s): POCBNP  CBG: Recent Labs  Lab 04/15/20 0738 04/15/20 1125 04/15/20 1558 04/15/20 2125 04/16/20 0749  GLUCAP 183* 175* 112* 134* 181*    Microbiology: Results for orders placed or performed during the hospital encounter of 03/15/2020  SARS Coronavirus 2 by RT PCR (hospital order, performed in [facility] hospital lab) Nasopharyngeal Nasopharyngeal Swab     Status: Abnormal   Collection Time: 04/13/2020  5:42 PM   Specimen: Nasopharyngeal Swab  Result Value Ref Range Status   SARS Coronavirus 2 POSITIVE (A) NEGATIVE Final    Comment: RESULT CALLED TO, READ BACK BY AND VERIFIED WITH: MAC BROWN 03/16/2020 AT 1935 BY AR (NOTE) SARS-CoV-2 target nucleic acids are DETECTED  SARS-CoV-2 RNA is generally detectable in upper respiratory specimens  during the acute phase of infection.  Positive results are indicative  of the presence of the identified virus, but do not rule out bacterial infection or co-infection with other pathogens not detected by the test.  Clinical correlation with patient history and  other diagnostic information is necessary to determine patient infection status.  The expected result is negative.  Fact Sheet for Patients:   StrictlyIdeas.no   Fact Sheet for Healthcare Providers:  BankingDealers.co.za    This test is not yet approved or cleared by the [place] and  has been authorized for detection and/or diagnosis of SARS-CoV-2 by FDA under an Emergency Use Authorization (EUA).  This EUA will remain in effect (meaning this test  can be used) for the duration of  the COVID-19 declaration under Section 564(b)(1) of the Act, 21 U.S.C. section 360-bbb-3(b)(1), unless the authorization is terminated or revoked sooner.  Performed at [facility], [address]  [place], [place] [address]   Culture, blood (Routine X 2) w Reflex to ID Panel     Status: None (Preliminary result)   Collection Time: 04/04/2020  9:12 PM   Specimen: BLOOD  Result Value Ref Range Status   Specimen Description BLOOD LEFT [facility]  Final   Special Requests   Final    BOTTLES DRAWN AEROBIC AND ANAEROBIC Blood Culture adequate volume   Culture   Final    NO GROWTH 2 DAYS Performed at [facility], [address], [place], [place] [address]    Report Status PENDING  Incomplete  Culture, blood (Routine X 2) w Reflex to ID Panel     Status: None (Preliminary result)   Collection Time: 03/26/2020  9:12 PM   Specimen: BLOOD  Result Value Ref Range Status   Specimen Description BLOOD LEFT [facility]  Final   Special Requests   Final    BOTTLES DRAWN AEROBIC AND ANAEROBIC Blood Culture adequate volume   Culture   Final    NO GROWTH 2 DAYS Performed at [facility], [address] [place], [place] [address]    Report Status PENDING  Incomplete  MRSA PCR Screening     Status: None   Collection Time: 04/15/20 12:15 AM   Specimen: Nasal Mucosa; Nasopharyngeal  Result Value Ref Range Status   MRSA by PCR NEGATIVE NEGATIVE Final    Comment:        The GeneXpert MRSA Assay (FDA approved for NASAL specimens only), is one component of a comprehensive MRSA colonization surveillance program. It is not intended to diagnose MRSA infection nor to guide or monitor treatment for MRSA infections. Performed at [facility], [address] [place],  [address]     Coagulation Studies: No results for input(s): LABPROT, INR in the last 72 hours.  Urinalysis: No results for input(s): COLORURINE, LABSPEC, PHURINE, GLUCOSEU, HGBUR, BILIRUBINUR, KETONESUR, PROTEINUR, UROBILINOGEN, NITRITE, LEUKOCYTESUR in the last 72 hours.  Invalid input(s): APPERANCEUR    Imaging: DG Chest 1 View  Result Date: 04/12/2020 CLINICAL DATA:  Shortness of breath.  Dialysis patient.  EXAM: CHEST  1 VIEW COMPARISON:  07/31/2019 FINDINGS: Extensive support apparatus artifact projecting over the right apex. Patient rotated minimally right. Moderate to marked enlargement of the cardiopericardial silhouette. Suspect small bilateral pleural effusions. No pneumothorax. Relatively diffuse interstitial and airspace disease, with relative sparing of the apices. IMPRESSION: Cardiomegaly with diffuse interstitial and airspace disease. Given clinical history, most likely due to pulmonary edema. Atypical infection or aspiration could look similar. Suspicion of layering bilateral pleural effusions. Electronically Signed   By: Abigail Miyamoto M.D.   On: 03/21/2020 18:13   CT HEAD WO CONTRAST  Result Date: 04/03/2020 CLINICAL DATA:  Mental status change EXAM: CT HEAD WITHOUT CONTRAST TECHNIQUE: Contiguous axial images were obtained from the base of the skull through the vertex without intravenous contrast. COMPARISON:  None. FINDINGS: Brain: No evidence of acute territorial infarction, hemorrhage, hydrocephalus,extra-axial collection or mass lesion/mass effect. Normal  gray-white differentiation. Ventricles are normal in size and contour. Vascular: No hyperdense vessel or unexpected calcification. Skull: The skull is intact. No fracture or focal lesion identified. Sinuses/Orbits: The visualized paranasal sinuses and mastoid air cells are clear. The orbits and globes intact. Other: None IMPRESSION: No acute intracranial abnormality. Electronically Signed   By: Prudencio Pair M.D.   On: 03/16/2020 21:38   Portable chest 1 View  Result Date: 04/15/2020 CLINICAL DATA:  Shortness of breath.  COVID positive. EXAM: PORTABLE CHEST 1 VIEW COMPARISON:  04/11/2020 FINDINGS: 0448 hours. Diffuse bilateral airspace disease again noted, right greater than left and mildly progressive in the interval. The cardio pericardial silhouette is enlarged. The visualized bony structures of the thorax show now acute abnormality.  Telemetry leads overlie the chest. IMPRESSION: Interval progression of diffuse asymmetric bilateral airspace disease, right greater than left, now appearing more confluent. No definite evidence of pleural effusion. Electronically Signed   By: Misty Stanley M.D.   On: 04/15/2020 04:59     Medications:   . norepinephrine    . sodium chloride    . norepinephrine (LEVOPHED) Adult infusion 2 mcg/min (04/16/20 0859)  . remdesivir 100 mg in NS 100 mL 100 mg (04/16/20 0853)   . amLODipine  10 mg Oral QHS  . vitamin C  500 mg Oral Daily  . Chlorhexidine Gluconate Cloth  6 each Topical Daily  . ferric citrate  630 mg Oral TID WC  . folic acid  1 mg Intravenous Daily  . heparin  5,000 Units Subcutaneous Q8H  . insulin aspart  0-5 Units Subcutaneous QHS  . insulin aspart  0-6 Units Subcutaneous TID WC  . levothyroxine  50 mcg Oral Daily  . losartan  50 mg Oral QHS  . methylPREDNISolone (SOLU-MEDROL) injection  40 mg Intravenous Q12H  . sodium chloride flush  10-40 mL Intracatheter Q12H  . thiamine injection  100 mg Intravenous Daily  . zinc sulfate  220 mg Oral Daily   acetaminophen, albuterol, guaiFENesin-dextromethorphan, ondansetron **OR** ondansetron (ZOFRAN) IV, sodium chloride flush  Assessment/ Plan:  Ms. Elvin Banker Blubaugh is a 64 y.o. black female with end stage renal disease on hemodialysis for last 23 years, hypertension, diabetes mellitus type II, hypothyroidism, who was admitted to [facility] on 04/06/2020 for Shortness of breath [R06.02] Uremia [N19] Acute and chronic respiratory failure (acute-on-chronic) (HCC) [J96.20] ESRD (end stage renal disease) ([place]) [N18.6] Acute respiratory failure with hypoxia ([place]) [J96.01] Troponin I above reference range [R77.8] High anion gap metabolic acidosis [C58.5] COVID-19 [U07.1]  [facility] ([place] TTS [place]. Right AVF 81.5kg  1. End Stage Renal Disease: with volume overload and respiratory failure requiring high flow  oxgyen.  - patient is not hemodynamically stable for intermittent hemodialysis at this time.  Low threshold to transition to continuous renal replacement therapy  2. Hypotension with volume overload - start norepinephrine - Discontinue amodipine and losartan  3. Anemia of chronic kidney disease: Hemoglobin 11.7. No indication for EPO during today's treatment. Mircera as outpatient.   4. Secondary Hyperparathyroidism: with hyperphosphatemia.  Lorin Picket with meals.   5. Acute respiratory failure with COVID-19 infection: Requiring high levels of oxygen.  - Appreciate pulmonary input.     LOS: 2 Gabor Lusk 9/2/20219:05 AM

## 2020-04-17 ENCOUNTER — Inpatient Hospital Stay: Payer: Medicare HMO

## 2020-04-17 DIAGNOSIS — J9621 Acute and chronic respiratory failure with hypoxia: Secondary | ICD-10-CM

## 2020-04-17 DIAGNOSIS — U071 COVID-19: Principal | ICD-10-CM

## 2020-04-17 DIAGNOSIS — J9622 Acute and chronic respiratory failure with hypercapnia: Secondary | ICD-10-CM

## 2020-04-17 DIAGNOSIS — I499 Cardiac arrhythmia, unspecified: Secondary | ICD-10-CM

## 2020-04-17 LAB — CBC
HCT: 37.9 % (ref 36.0–46.0)
Hemoglobin: 13 g/dL (ref 12.0–15.0)
MCH: 28.9 pg (ref 26.0–34.0)
MCHC: 34.3 g/dL (ref 30.0–36.0)
MCV: 84.2 fL (ref 80.0–100.0)
Platelets: 393 10*3/uL (ref 150–400)
RBC: 4.5 MIL/uL (ref 3.87–5.11)
RDW: 15.8 % — ABNORMAL HIGH (ref 11.5–15.5)
WBC: 11.3 10*3/uL — ABNORMAL HIGH (ref 4.0–10.5)
nRBC: 0.9 % — ABNORMAL HIGH (ref 0.0–0.2)

## 2020-04-17 LAB — CBC WITH DIFFERENTIAL/PLATELET
Abs Immature Granulocytes: 0.13 10*3/uL — ABNORMAL HIGH (ref 0.00–0.07)
Basophils Absolute: 0.1 10*3/uL (ref 0.0–0.1)
Basophils Relative: 1 %
Eosinophils Absolute: 0.1 10*3/uL (ref 0.0–0.5)
Eosinophils Relative: 2 %
HCT: 37.3 % (ref 36.0–46.0)
Hemoglobin: 13 g/dL (ref 12.0–15.0)
Immature Granulocytes: 2 %
Lymphocytes Relative: 8 %
Lymphs Abs: 0.6 10*3/uL — ABNORMAL LOW (ref 0.7–4.0)
MCH: 29.6 pg (ref 26.0–34.0)
MCHC: 34.9 g/dL (ref 30.0–36.0)
MCV: 85 fL (ref 80.0–100.0)
Monocytes Absolute: 0.5 10*3/uL (ref 0.1–1.0)
Monocytes Relative: 7 %
Neutro Abs: 5.8 10*3/uL (ref 1.7–7.7)
Neutrophils Relative %: 80 %
Platelets: 363 10*3/uL (ref 150–400)
RBC: 4.39 MIL/uL (ref 3.87–5.11)
RDW: 15.4 % (ref 11.5–15.5)
Smear Review: NORMAL
WBC: 7.2 10*3/uL (ref 4.0–10.5)
nRBC: 0.8 % — ABNORMAL HIGH (ref 0.0–0.2)

## 2020-04-17 LAB — COMPREHENSIVE METABOLIC PANEL
ALT: 13 U/L (ref 0–44)
ALT: 13 U/L (ref 0–44)
AST: 25 U/L (ref 15–41)
AST: 27 U/L (ref 15–41)
Albumin: 2.5 g/dL — ABNORMAL LOW (ref 3.5–5.0)
Albumin: 2.5 g/dL — ABNORMAL LOW (ref 3.5–5.0)
Alkaline Phosphatase: 66 U/L (ref 38–126)
Alkaline Phosphatase: 70 U/L (ref 38–126)
Anion gap: 19 — ABNORMAL HIGH (ref 5–15)
Anion gap: 15 (ref 5–15)
BUN: 84 mg/dL — ABNORMAL HIGH (ref 8–23)
BUN: 53 mg/dL — ABNORMAL HIGH (ref 8–23)
CO2: 25 mmol/L (ref 22–32)
CO2: 31 mmol/L (ref 22–32)
Calcium: 8.8 mg/dL — ABNORMAL LOW (ref 8.9–10.3)
Calcium: 8.7 mg/dL — ABNORMAL LOW (ref 8.9–10.3)
Chloride: 95 mmol/L — ABNORMAL LOW (ref 98–111)
Chloride: 97 mmol/L — ABNORMAL LOW (ref 98–111)
Creatinine, Ser: 13.42 mg/dL — ABNORMAL HIGH (ref 0.44–1.00)
Creatinine, Ser: 9.14 mg/dL — ABNORMAL HIGH (ref 0.44–1.00)
GFR calc Af Amer: 3 mL/min — ABNORMAL LOW (ref >60)
GFR calc Af Amer: 5 mL/min — ABNORMAL LOW (ref >60)
GFR calc non Af Amer: 3 mL/min — ABNORMAL LOW (ref >60)
GFR calc non Af Amer: 4 mL/min — ABNORMAL LOW (ref >60)
Glucose, Bld: 261 mg/dL — ABNORMAL HIGH (ref 70–99)
Glucose, Bld: 155 mg/dL — ABNORMAL HIGH (ref 70–99)
Potassium: 4.6 mmol/L (ref 3.5–5.1)
Potassium: 3.8 mmol/L (ref 3.5–5.1)
Sodium: 139 mmol/L (ref 135–145)
Sodium: 143 mmol/L (ref 135–145)
Total Bilirubin: 1.1 mg/dL (ref 0.3–1.2)
Total Bilirubin: 1.2 mg/dL (ref 0.3–1.2)
Total Protein: 6.4 g/dL — ABNORMAL LOW (ref 6.5–8.1)
Total Protein: 6.3 g/dL — ABNORMAL LOW (ref 6.5–8.1)

## 2020-04-17 LAB — FERRITIN: Ferritin: 5454 ng/mL — ABNORMAL HIGH (ref 11–307)

## 2020-04-17 LAB — GLUCOSE, CAPILLARY
Glucose-Capillary: 146 mg/dL — ABNORMAL HIGH (ref 70–99)
Glucose-Capillary: 158 mg/dL — ABNORMAL HIGH (ref 70–99)
Glucose-Capillary: 208 mg/dL — ABNORMAL HIGH (ref 70–99)
Glucose-Capillary: 227 mg/dL — ABNORMAL HIGH (ref 70–99)
Glucose-Capillary: 235 mg/dL — ABNORMAL HIGH (ref 70–99)
Glucose-Capillary: 251 mg/dL — ABNORMAL HIGH (ref 70–99)

## 2020-04-17 LAB — MAGNESIUM
Magnesium: 2.3 mg/dL (ref 1.7–2.4)
Magnesium: 1.8 mg/dL (ref 1.7–2.4)

## 2020-04-17 LAB — TROPONIN I (HIGH SENSITIVITY)
Troponin I (High Sensitivity): 135 ng/L (ref <18)
Troponin I (High Sensitivity): 165 ng/L (ref <18)

## 2020-04-17 LAB — PHOSPHORUS
Phosphorus: 8.4 mg/dL — ABNORMAL HIGH (ref 2.5–4.6)
Phosphorus: 5.5 mg/dL — ABNORMAL HIGH (ref 2.5–4.6)

## 2020-04-17 LAB — C-REACTIVE PROTEIN: CRP: 20.9 mg/dL — ABNORMAL HIGH (ref <1.0)

## 2020-04-17 LAB — FIBRIN DERIVATIVES D-DIMER (ARMC ONLY): Fibrin derivatives D-dimer (ARMC): 877.86 ng/mL (FEU) — ABNORMAL HIGH (ref 0.00–499.00)

## 2020-04-17 MED ORDER — LEVOTHYROXINE SODIUM 50 MCG PO TABS: 50.0000 ug | ORAL_TABLET | Freq: Every day | ORAL | Status: DC

## 2020-04-17 MED ORDER — MORPHINE SULFATE (PF) 2 MG/ML IV SOLN
INTRAVENOUS | Status: AC
  Filled 2020-04-17: qty 1

## 2020-04-17 MED ORDER — HALOPERIDOL LACTATE 5 MG/ML IJ SOLN
2.0000 mg | Freq: Once | INTRAMUSCULAR | Status: AC
  Administered 2020-04-17: 2 mg via INTRAVENOUS

## 2020-04-17 MED ORDER — NOREPINEPHRINE 16 MG/250ML-% IV SOLN
0.0000 ug/min | INTRAVENOUS | Status: DC
  Administered 2020-04-17: 8 ug/min via INTRAVENOUS
  Administered 2020-04-17: 25 ug/min via INTRAVENOUS
  Administered 2020-04-18: 20 ug/min via INTRAVENOUS
  Administered 2020-04-18: 28 ug/min via INTRAVENOUS
  Filled 2020-04-17 (×4): qty 250

## 2020-04-17 MED ORDER — INSULIN ASPART 100 UNIT/ML ~~LOC~~ SOLN
0.0000 [IU] | SUBCUTANEOUS | Status: DC
  Administered 2020-04-17: 2 [IU] via SUBCUTANEOUS
  Administered 2020-04-17: 3 [IU] via SUBCUTANEOUS
  Administered 2020-04-17: 1 [IU] via SUBCUTANEOUS
  Administered 2020-04-17 (×2): 2 [IU] via SUBCUTANEOUS
  Administered 2020-04-18 (×3): 1 [IU] via SUBCUTANEOUS
  Filled 2020-04-17 (×7): qty 1

## 2020-04-17 MED ORDER — FERRIC CITRATE 1 GM 210 MG(FE) PO TABS
630.0000 mg | ORAL_TABLET | Freq: Three times a day (TID) | ORAL | Status: DC
  Filled 2020-04-17 (×6): qty 3

## 2020-04-17 MED ORDER — MORPHINE SULFATE (PF) 2 MG/ML IV SOLN
2.0000 mg | INTRAVENOUS | Status: DC | PRN
  Administered 2020-04-17 – 2020-04-18 (×6): 2 mg via INTRAVENOUS
  Filled 2020-04-17 (×5): qty 1

## 2020-04-17 MED ORDER — MAGNESIUM SULFATE IN D5W 1-5 GM/100ML-% IV SOLN
1.0000 g | Freq: Once | INTRAVENOUS | Status: AC
  Administered 2020-04-17: 1 g via INTRAVENOUS
  Filled 2020-04-17: qty 100

## 2020-04-17 NOTE — Progress Notes (Signed)
Inpatient Diabetes Program Recommendations  AACE/ADA: New Consensus Statement on Inpatient Glycemic Control (2015)  Target Ranges:  Prepandial:   less than 140 mg/dL      Peak postprandial:   less than 180 mg/dL (1-2 hours)      Critically ill patients:  140 - 180 mg/dL   Results for NETA, UPADHYAY (MRN [id number]) as of 04/17/2020 10:14  Ref. Range 04/16/2020 07:49 04/16/2020 11:09 04/16/2020 16:10 04/16/2020 19:42 04/16/2020 21:51  Glucose-Capillary Latest Ref Range: 70 - 99 mg/dL 181 (H)  1 unit NOVOLOG  191 (H)  1 unit NOVOLOG  203 (H)  1 unit NOVOLOG  232 (H) 234 (H)  2 units NOVOLOG    Results for LATALIA, ETZLER (MRN [id number]) as of 04/17/2020 10:14  Ref. Range 04/17/2020 03:44 04/17/2020 07:20  Glucose-Capillary Latest Ref Range: 70 - 99 mg/dL 251 (H)  3 units NOVOLOG  235 (H)  2 units NOVOLOG     Admit with: Acute hypoxic hypercapnic respiratory failure secondary to pulmonary edema in the setting of ESRD and COVID-19  History: DM, ESRD  Home DM Meds: None listed  Current Orders: Novolog 0-6 units Q4 hours      Solumedrol 40 mg BID     MD- Note CBGs >200.  Given COVID+ infection and administration pf steroids, please consider the following:  1. Start Levemir 8 units daily (0.1 units/kg)--please start this AM  2. Increase the Novolog SSI to the 0-9 unit scale Q4 hours     --Will follow patient during hospitalization--  Wyn Quaker RN, MSN, CDE Diabetes Coordinator Inpatient Glycemic Control Team Team Pager: [phone] (8a-5p)

## 2020-04-17 NOTE — Progress Notes (Addendum)
Pt alert to self only, follows commands. Dialysis today per Nephrology. Son updated on POC. RN trialed HFNC on its own without non-rebreather. De-saturated into 80's. Unable to tolerate HFNC by itself, non-rebreather re-applied. SVT rate 160's for 6 seconds per CCMD. More frequent ectopy. MD made aware. Echo is ordered.  Pt turns head left and right repeatedly. Able to follow commands and say her name. Can track RN. MD made aware. Unable to take PO due to confusion and lethargy. Pt anuric. Will continue to monitor.

## 2020-04-17 NOTE — Progress Notes (Signed)
[facility]  ROUNDING NOTE   Subjective:   Patient requiring more norepinephrine to stabilize her blood pressure. Increased work of breath. Patient unable to talk due to dyspna.  Right femoral trialysis catheter placed yesterday in anticipation of CRRT. However due to staffing issues, patient was unable to get renal replacement therapy.   Objective:  Vital signs in last 24 hours:  Temp:  [96.5 F (35.8 C)-98.2 F (36.8 C)] 96.5 F (35.8 C) (09/03 1130) Pulse Rate:  [48-92] 65 (09/03 1130) Resp:  [18-31] 27 (09/03 1203) BP: (66-152)/(22-127) 130/95 (09/03 1203) SpO2:  [80 %-99 %] 97 % (09/03 1203) FiO2 (%):  [100 %] 100 % (09/03 0400)  Weight change:  Filed Weights   04/12/2020 1739 04/05/2020 2230  Weight: 81.2 kg 79.8 kg    Intake/Output: I/O last 3 completed shifts: In: 909.8 [I.V.:747.2; IV Piggyback:162.6] Out: -    Intake/Output this shift:  Total I/O In: 334 [I.V.:234; IV Piggyback:100] Out: 0   Physical Exam: General: Critically ill  Head: Normocephalic, atraumatic. Moist oral mucosal membranes  Eyes: Anicteric, PERRL  Neck:  trachea midline  Lungs:  Bilateral crackles HFNC 15L O2  Heart: Regular rate and rhythm  Abdomen:  Soft,   Extremities:  + peripheral edema.  Neurologic: Able to answer yes and no questions, following commands.   Skin: No lesions  Access: Right AVF, right temp femoral HD catheter 9/2 Dr. Delana Meyer    Basic Metabolic Panel: Recent Labs  Lab 03/18/2020 1742 03/24/2020 1742 04/15/20 0505 04/16/20 0335 04/17/20 0356  NA 140  --  139 140 139  K 4.2  --  4.5 4.4 4.6  CL 93*  --  95* 93* 95*  CO2 22  --  22 21* 25  GLUCOSE 149*  --  197* 180* 261*  BUN 79*  --  85* 54* 84*  CREATININE 17.93*  --  18.58* 11.74* 13.42*  CALCIUM 8.3*   < > 8.2* 8.6* 8.8*  MG 2.3  --  2.5* 2.2 2.3  PHOS  --   --  8.8* 7.5* 8.4*   < > = values in this interval not displayed.    Liver Function Tests: Recent Labs  Lab 04/12/2020 1742  04/15/20 0505 04/16/20 0335 04/17/20 0356  AST 60* 48* 37 25  ALT _0 ALKPHOS 65 59 59 66  BILITOT 1.3* 1.6* 2.5* 1.1  PROT 6.9 6.6 6.1* 6.4*  ALBUMIN 3.1* 2.9* 2.6* 2.5*   No results for input(s): LIPASE, AMYLASE in the last 168 hours. No results for input(s): AMMONIA in the last 168 hours.  CBC: Recent Labs  Lab 03/22/2020 1742 04/15/20 0505 04/16/20 0335 04/17/20 0356  WBC 4.2 3.5* 6.1 7.2  NEUTROABS 3.4 2.9 5.2 5.8  HGB 11.7* 11.9* 11.7* 13.0  HCT 35.6* 34.6* 35.5* 37.3  MCV 87.5 85.4 88.3 85.0  PLT 210 187 260 363    Cardiac Enzymes: No results for input(s): CKTOTAL, CKMB, CKMBINDEX, TROPONINI in the last 168 hours.  BNP: Invalid input(s): POCBNP  CBG: Recent Labs  Lab 04/16/20 1942 04/16/20 2151 04/17/20 0344 04/17/20 0720 04/17/20 1132  GLUCAP 232* 234* 251* 235* 22*    Microbiology: Results for orders placed or performed during the hospital encounter of 04/10/2020  SARS Coronavirus 2 by RT PCR (hospital order, performed in [facility] hospital lab) Nasopharyngeal Nasopharyngeal Swab     Status: Abnormal   Collection Time: 03/31/2020  5:42 PM   Specimen: Nasopharyngeal Swab  Result Value Ref Range  Status   SARS Coronavirus 2 POSITIVE (A) NEGATIVE Final    Comment: RESULT CALLED TO, READ BACK BY AND VERIFIED WITH: MAC BROWN 04/13/2020 AT 1935 BY AR (NOTE) SARS-CoV-2 target nucleic acids are DETECTED  SARS-CoV-2 RNA is generally detectable in upper respiratory specimens  during the acute phase of infection.  Positive results are indicative  of the presence of the identified virus, but do not rule out bacterial infection or co-infection with other pathogens not detected by the test.  Clinical correlation with patient history and  other diagnostic information is necessary to determine patient infection status.  The expected result is negative.  Fact Sheet for Patients:   StrictlyIdeas.no   Fact Sheet for Healthcare  Providers:   BankingDealers.co.za    This test is not yet approved or cleared by the [place] FDA and  has been authorized for detection and/or diagnosis of SARS-CoV-2 by FDA under an Emergency Use Authorization (EUA).  This EUA will remain in effect (meaning this test  can be used) for the duration of  the COVID-19 declaration under Section 564(b)(1) of the Act, 21 U.S.C. section 360-bbb-3(b)(1), unless the authorization is terminated or revoked sooner.  Performed at [facility], [address] [place], [place] [address]   Culture, blood (Routine X 2) w Reflex to ID Panel     Status: None (Preliminary result)   Collection Time: 04/05/2020  9:12 PM   Specimen: BLOOD  Result Value Ref Range Status   Specimen Description BLOOD LEFT [facility]  Final   Special Requests   Final    BOTTLES DRAWN AEROBIC AND ANAEROBIC Blood Culture adequate volume   Culture   Final    NO GROWTH 3 DAYS Performed at [facility], [address] [place], [place] [address]    Report Status PENDING  Incomplete  Culture, blood (Routine X 2) w Reflex to ID Panel     Status: None (Preliminary result)   Collection Time: 03/18/2020  9:12 PM   Specimen: BLOOD  Result Value Ref Range Status   Specimen Description BLOOD LEFT [facility]  Final   Special Requests   Final    BOTTLES DRAWN AEROBIC AND ANAEROBIC Blood Culture adequate volume   Culture   Final    NO GROWTH 3 DAYS Performed at [facility], [address], [place], [place] [address]    Report Status PENDING  Incomplete  MRSA PCR Screening     Status: None   Collection Time: 04/15/20 12:15 AM   Specimen: Nasal Mucosa; Nasopharyngeal  Result Value Ref Range Status   MRSA by PCR NEGATIVE NEGATIVE Final    Comment:        The GeneXpert MRSA Assay (FDA approved for NASAL specimens only), is one component of a comprehensive MRSA colonization surveillance program. It is not intended to diagnose MRSA infection  nor to guide or monitor treatment for MRSA infections. Performed at [facility], [address] [place], [place] [address]     Coagulation Studies: No results for input(s): LABPROT, INR in the last 72 hours.  Urinalysis: No results for input(s): COLORURINE, LABSPEC, PHURINE, GLUCOSEU, HGBUR, BILIRUBINUR, KETONESUR, PROTEINUR, UROBILINOGEN, NITRITE, LEUKOCYTESUR in the last 72 hours.  Invalid input(s): APPERANCEUR    Imaging: DG Chest Port 1 View  Result Date: 04/17/2020 CLINICAL DATA:  Acute respiratory failure with hypoxia EXAM: PORTABLE CHEST 1 VIEW COMPARISON:  04/15/2020 and prior exams FINDINGS: The cardiomediastinal silhouette is unchanged with enlarged cardiopericardial silhouette. Bilateral airspace opacities are again  noted and appear slightly decreased on the RIGHT. This is a low volume study.  No pneumothorax. No other interval changes noted. IMPRESSION: Slightly decreased RIGHT airspace opacities otherwise unchanged appearance of the chest. No pneumothorax. Electronically Signed   By: Margarette Canada M.D.   On: 04/17/2020 05:41     Medications:   . sodium chloride    . sodium chloride    . dexmedetomidine (PRECEDEX) IV infusion 0.1 mcg/kg/hr (04/17/20 1147)  . norepinephrine (LEVOPHED) Adult infusion 8 mcg/min (04/17/20 1147)  . norepinephrine (LEVOPHED) Adult infusion Stopped (04/17/20 1131)   . Chlorhexidine Gluconate Cloth  6 each Topical Daily  . folic acid  1 mg Intravenous Daily  . heparin  5,000 Units Subcutaneous Q8H  . insulin aspart  0-6 Units Subcutaneous Q4H  . methylPREDNISolone (SOLU-MEDROL) injection  40 mg Intravenous Q12H  . pantoprazole (PROTONIX) IV  40 mg Intravenous Q24H  . sodium chloride flush  10-40 mL Intracatheter Q12H  . thiamine injection  100 mg Intravenous Daily   acetaminophen, albuterol, guaiFENesin-dextromethorphan, ondansetron **OR** ondansetron (ZOFRAN) IV, sodium chloride flush  Assessment/ Plan:  Ms. Anna [name] is  a 64 y.o. 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 Right AVF 81.5kg  1. End Stage Renal Disease: with volume overload and respiratory failure requiring high flow oxgyen.  - patient is not hemodynamically stable however no staff for  continuous renal replacement therapy. Will attempt intermittent hemodialysis treatment today.   2. Hypotension with volume overload. Holding home amodipine and losartan -  norepinephrine  3. Anemia of chronic kidney disease: No indication for EPO during today's treatment. Mircera as outpatient.   4. Secondary Hyperparathyroidism: with hyperphosphatemia.  Lorin Picket with meals.   5. Acute respiratory failure with COVID-19 infection: Requiring high levels of oxygen.  - Appreciate pulmonary input.     LOS: 3 Anna [name] 9/3/20211:10 PM

## 2020-04-17 NOTE — Progress Notes (Signed)
Name: Anna [name] MRN: [id number] DOB: 02-Apr-1956    ADMISSION DATE:  04/07/2020 CONSULTATION DATE: 03/24/2020  REFERRING MD : Dr. Tamala Julian   CHIEF COMPLAINT: Shortness of Breath   BRIEF PATIENT DESCRIPTION:  37 female with ESRD on hemodialysis admitted with acute hypoxic hypercapnic respiratory failure secondary to pulmonary edema and COVID-19 requiring HFNC  SIGNIFICANT EVENTS/STUDIES:  08/31: Pt admitted to ICU requiring HFNC  08/31: CT Head revealed no acute intracranial abnormality. 04/15/20- patient is receiving HD, She does not have IV access will get midline placed.  04/16/20- patient had uneventful day. Plan for trialysis line placement as per renal team since they do not have staff to cannulate her AV fistula.  Nephrologist asked vascular team to place catheter. 04/17/20- trial intermittent slow HD per nephrology  HISTORY OF PRESENT ILLNESS:   This is a 64 yo with a PMH of Thyrotoxicosis, Morbid Obesity, Malignant HTN, Hyperparathyroidism, Hypercalcemia, Hepatitis C, ESRD, Type II Diabetes Mellitus, Cardiomyopathy, and Anemia.  She presented to [facility] ER on 08/31 from the dialysis center with O2 sats of 33% on room air.  Per ER notes she was diagnosed with COVID-19 last week and has had shortness of breath/cough.  In the ER COVID-19 positive, and CXR concerning for pulmonary edema/atypical pneumonia.  On call nephrologist contacted by ER physician, and plan is for pt to undergo hemodialysis on  04/15/2020.  Lab results revealed chloride 93, glucose 149, BUN 79, creatinine 17.93, calcium 8.3, anion gap 25, AST 60, BNP 645.4, troponin 324, pct 1.06, hgb 11.7, and pH 7.38/pCO2 43/bicarb 25.4.  She was placed 15L HFNC. She was subsequently admitted to ICU for additional workup and treatment.    PAST MEDICAL HISTORY :   has a past medical history of Anemia, AV fistula ([place]), Cardiomyopathy ([place]), Cardiomyopathy ([place]), Diabetes mellitus type 2 in obese ([place]), ESRD (end stage renal disease)  ([place]), Full dentures, Hepatitis C, Hypercalcemia, Hyperparathyroidism, secondary ([place]), Malignant hypertension, Morbid obesity ([place]), and Thyrotoxicosis.  has a past surgical history that includes AV fistula placement (2013); Incisional hernia repair (2007); AV fistula placement (2008); AV fistula placement (2008); AV fistula repair (2008); Cholecystectomy (2003); Carpal tunnel release (Right, 03/10/2014); Fistulogram (N/A, 10/26/2011); and A/V Fistulagram (N/A, 10/10/2016). Prior to Admission medications   Medication Sig Start Date End Date Taking? Authorizing Provider  amLODipine (NORVASC) 10 MG tablet Take 10 mg by mouth at bedtime. 06/02/19  Yes [provider]  AURYXIA 1 GM 210 MG(Fe) tablet Take 630 mg by mouth 3 (three) times daily with meals.  02/03/20  Yes [provider]  levothyroxine (SYNTHROID, LEVOTHROID) 50 MCG tablet Take 50 mcg by mouth daily. 08/04/16  Yes [provider]  losartan (COZAAR) 50 MG tablet Take 50 mg by mouth at bedtime. 07/04/19  Yes [provider]  ibuprofen (ADVIL,MOTRIN) 800 MG tablet Take 800 mg by mouth every 8 (eight) hours as needed for mild pain.     [provider]  meloxicam (MOBIC) 15 MG tablet Take 1 tablet (15 mg total) by mouth daily. Patient not taking: Reported on 03/15/2020 05/22/16   Victorino Dike, FNP   Allergies  Allergen Reactions  . Latex Itching    LATEX GLOVES    FAMILY HISTORY:  family history is not on file. SOCIAL HISTORY:  reports that she has quit smoking. She has never used smokeless tobacco. She reports that she does not drink alcohol and does not use drugs.  SUBJECTIVE:  Remains HHFNC today  VITAL SIGNS: Temp:  [97.7  F (36.5 C)-98.2 F (36.8 C)] 97.7 F (36.5 C) (09/03 0400) Pulse Rate:  [41-92] 61 (09/03 0400) Resp:  [18-30] 22 (09/03 0600) BP: (66-152)/(22-127) 134/53 (09/03 0600) SpO2:  [80 %-98 %] 98 % (09/03 0400) FiO2 (%):  [100 %] 100 % (09/03 0400)  PHYSICAL  EXAMINATION: General: acutely ill appearing female, NAD on HFNC  Neuro: alert, oriented to self and place only, follows commands, PERRLA HEENT: supple, no JVD Cardiovascular: irregular irregular, no R/G  Lungs: faint crackles throughout, even, non labored  Abdomen: +BS x4, obese, soft, non tender, non distended  Musculoskeletal: 1+ bilateral lower extremity edema Skin: RUE av fistula +bruit and thrill  Recent Labs  Lab 04/15/20 0505 04/16/20 0335 04/17/20 0356  NA 139 140 139  K 4.5 4.4 4.6  CL 95* 93* 95*  CO2 22 21* 25  BUN 85* 54* 84*  CREATININE 18.58* 11.74* 13.42*  GLUCOSE 197* 180* 261*   Recent Labs  Lab 04/15/20 0505 04/16/20 0335 04/17/20 0356  HGB 11.9* 11.7* 13.0  HCT 34.6* 35.5* 37.3  WBC 3.5* 6.1 7.2  PLT 187 260 363   DG Chest Port 1 View  Result Date: 04/17/2020 CLINICAL DATA:  Acute respiratory failure with hypoxia EXAM: PORTABLE CHEST 1 VIEW COMPARISON:  04/15/2020 and prior exams FINDINGS: The cardiomediastinal silhouette is unchanged with enlarged cardiopericardial silhouette. Bilateral airspace opacities are again noted and appear slightly decreased on the RIGHT. This is a low volume study.  No pneumothorax. No other interval changes noted. IMPRESSION: Slightly decreased RIGHT airspace opacities otherwise unchanged appearance of the chest. No pneumothorax. Electronically Signed   By: Margarette Canada M.D.   On: 04/17/2020 05:41    ASSESSMENT / PLAN:  Acute hypoxic hypercapnic respiratory failure  COVID-19 Volume overload/pulmonary edema Morbid obesity with COVID-19  Maintain HHFNC or NIV Continue remdesivir Presently on solumedrol 40mg  IV q 12h, will maintain  Trend inflammatory markers for need of baricitinib  Prn bronchodilator therapy Encourage Self-proning  Encourage OOB to chair  Aggressive pulmonary hygiene  Malignant HTN Elevated troponin likely secondary to demand ischemia in the setting of acute respiratory failure  Hx:  Cardiomyopathy Continuous telemetry monitoring  Continue outpatient antihypertensives  Troponin peaked 324 on 8/31, now 135  Will check echo, no acute ST segments correlating with peak  ESRD on hemodialysis  Trend BMP  Replace electrolytes as indicated  Avoid nephrotoxic medications D/W nephrology, will attempt slow 2hr HD today, K and bicarb are WNL   Anemia without obvious signs of acute blood loss VTE px: subq heparin and SCD's  Trend CBC  Monitor s/sx of bleeding and transfuse hgb <7  Type II diabetes mellitus  CBG's ac/hs SSI   Acute encephalopathy likely secondary to infectious process  Frequent reorientation  Avoid sedating medications    Critical care provider statement:    Critical care time (minutes):  36   Critical care time was exclusive of:  Separately billable procedures and  treating other patients   Critical care was necessary to treat or prevent imminent or  life-threatening deterioration of the following conditions:  Acute hypoxemic respiratory failure, COVID19 pneumonia, ESRD, encephalopathy.   Critical care was time spent personally by me on the following  activities:  Development of treatment plan with patient or surrogate,  discussions with consultants, evaluation of patient's response to  treatment, examination of patient, obtaining history from patient or  surrogate, ordering and performing treatments and interventions, ordering  and review of laboratory studies and re-evaluation of patient's condition  Hanley Seamen, MD

## 2020-04-17 NOTE — Progress Notes (Signed)
Patient remains lethargic during the shift with intermittent confusion. Patient is only able to verbalize being alert to self. Patient pulled off bubble cannula and NRB while trying to get out of bed once this shift which caused her to quickly desaturate into the 60s. Therefore, patient was placed on a precedex drip and patient safety mitts were applied to prevent oxygen mask removal. Patient is also on a levophed drip to maintain BP. Patient is now resting comfortably with a SPO2 of 94% on the bubble cannula/NRB combination. She is in no apparent distress and all vitals are currently stable. Will continue to monitor.  Cameron Ali, RN

## 2020-04-18 ENCOUNTER — Inpatient Hospital Stay (HOSPITAL_COMMUNITY)
Admit: 2020-04-18 | Discharge: 2020-04-18 | Disposition: A | Discharge status: Home / Self Care | Payer: Medicare HMO | Attending: Pulmonary Disease | Admitting: Pulmonary Disease

## 2020-04-18 DIAGNOSIS — Z992 Dependence on renal dialysis: Secondary | ICD-10-CM

## 2020-04-18 DIAGNOSIS — N189 Chronic kidney disease, unspecified: Secondary | ICD-10-CM

## 2020-04-18 DIAGNOSIS — I340 Nonrheumatic mitral (valve) insufficiency: Secondary | ICD-10-CM

## 2020-04-18 DIAGNOSIS — N170 Acute kidney failure with tubular necrosis: Secondary | ICD-10-CM

## 2020-04-18 LAB — CBC WITH DIFFERENTIAL/PLATELET
Abs Immature Granulocytes: 0.12 10*3/uL — ABNORMAL HIGH (ref 0.00–0.07)
Basophils Absolute: 0 10*3/uL (ref 0.0–0.1)
Basophils Relative: 0 %
Eosinophils Absolute: 0 10*3/uL (ref 0.0–0.5)
Eosinophils Relative: 0 %
HCT: 37.3 % (ref 36.0–46.0)
Hemoglobin: 12.3 g/dL (ref 12.0–15.0)
Immature Granulocytes: 1 %
Lymphocytes Relative: 3 %
Lymphs Abs: 0.4 10*3/uL — ABNORMAL LOW (ref 0.7–4.0)
MCH: 28.7 pg (ref 26.0–34.0)
MCHC: 33 g/dL (ref 30.0–36.0)
MCV: 86.9 fL (ref 80.0–100.0)
Monocytes Absolute: 0.5 10*3/uL (ref 0.1–1.0)
Monocytes Relative: 4 %
Neutro Abs: 10.9 10*3/uL — ABNORMAL HIGH (ref 1.7–7.7)
Neutrophils Relative %: 92 %
Platelets: 344 10*3/uL (ref 150–400)
RBC: 4.29 MIL/uL (ref 3.87–5.11)
RDW: 15.8 % — ABNORMAL HIGH (ref 11.5–15.5)
WBC Morphology: INCREASED
WBC: 11.9 10*3/uL — ABNORMAL HIGH (ref 4.0–10.5)
nRBC: 0.9 % — ABNORMAL HIGH (ref 0.0–0.2)

## 2020-04-18 LAB — ECHOCARDIOGRAM COMPLETE
AR max vel: 1.19 cm2
AV Area VTI: 1.18 cm2
AV Area mean vel: 1.28 cm2
AV Mean grad: 15 mmHg
AV Peak grad: 29.6 mmHg
Ao pk vel: 2.72 m/s
Area-P 1/2: 4.17 cm2
Height: 61 in
S' Lateral: 2.58 cm
Weight: 2814.83 oz

## 2020-04-18 LAB — COMPREHENSIVE METABOLIC PANEL
ALT: 14 U/L (ref 0–44)
AST: 33 U/L (ref 15–41)
Albumin: 2.3 g/dL — ABNORMAL LOW (ref 3.5–5.0)
Alkaline Phosphatase: 68 U/L (ref 38–126)
Anion gap: 20 — ABNORMAL HIGH (ref 5–15)
BUN: 59 mg/dL — ABNORMAL HIGH (ref 8–23)
CO2: 25 mmol/L (ref 22–32)
Calcium: 8.6 mg/dL — ABNORMAL LOW (ref 8.9–10.3)
Chloride: 96 mmol/L — ABNORMAL LOW (ref 98–111)
Creatinine, Ser: 9.86 mg/dL — ABNORMAL HIGH (ref 0.44–1.00)
GFR calc Af Amer: 4 mL/min — ABNORMAL LOW (ref >60)
GFR calc non Af Amer: 4 mL/min — ABNORMAL LOW (ref >60)
Glucose, Bld: 156 mg/dL — ABNORMAL HIGH (ref 70–99)
Potassium: 4 mmol/L (ref 3.5–5.1)
Sodium: 141 mmol/L (ref 135–145)
Total Bilirubin: 1.3 mg/dL — ABNORMAL HIGH (ref 0.3–1.2)
Total Protein: 5.8 g/dL — ABNORMAL LOW (ref 6.5–8.1)

## 2020-04-18 LAB — PHOSPHORUS: Phosphorus: 5.1 mg/dL — ABNORMAL HIGH (ref 2.5–4.6)

## 2020-04-18 LAB — GLUCOSE, CAPILLARY
Glucose-Capillary: 135 mg/dL — ABNORMAL HIGH (ref 70–99)
Glucose-Capillary: 147 mg/dL — ABNORMAL HIGH (ref 70–99)
Glucose-Capillary: 168 mg/dL — ABNORMAL HIGH (ref 70–99)
Glucose-Capillary: 170 mg/dL — ABNORMAL HIGH (ref 70–99)
Glucose-Capillary: 161 mg/dL — ABNORMAL HIGH (ref 70–99)
Glucose-Capillary: 127 mg/dL — ABNORMAL HIGH (ref 70–99)

## 2020-04-18 LAB — MAGNESIUM: Magnesium: 2 mg/dL (ref 1.7–2.4)

## 2020-04-18 LAB — FERRITIN: Ferritin: 4072 ng/mL — ABNORMAL HIGH (ref 11–307)

## 2020-04-18 LAB — FIBRIN DERIVATIVES D-DIMER (ARMC ONLY): Fibrin derivatives D-dimer (ARMC): 2769.67 ng/mL (FEU) — ABNORMAL HIGH (ref 0.00–499.00)

## 2020-04-18 LAB — C-REACTIVE PROTEIN: CRP: 19.4 mg/dL — ABNORMAL HIGH (ref <1.0)

## 2020-04-18 MED ORDER — SODIUM CHLORIDE 0.9 % IV SOLN: 100.0000 mg | Freq: Every day | INTRAVENOUS | Status: DC

## 2020-04-18 MED ORDER — ORAL CARE MOUTH RINSE
15.0000 mL | Freq: Two times a day (BID) | OROMUCOSAL | Status: DC
  Administered 2020-04-18 (×2): 15 mL via OROMUCOSAL

## 2020-04-18 MED ORDER — FENTANYL CITRATE (PF) 100 MCG/2ML IJ SOLN
200.0000 ug | Freq: Once | INTRAMUSCULAR | Status: AC
  Administered 2020-04-19: 200 ug via INTRAVENOUS
  Filled 2020-04-18: qty 4

## 2020-04-18 MED ORDER — ETOMIDATE 2 MG/ML IV SOLN
20.0000 mg | Freq: Once | INTRAVENOUS | Status: AC
  Administered 2020-04-19: 20 mg via INTRAVENOUS
  Filled 2020-04-18: qty 10

## 2020-04-18 MED ORDER — VECURONIUM BROMIDE 10 MG IV SOLR
10.0000 mg | Freq: Once | INTRAVENOUS | Status: AC
  Administered 2020-04-19: 10 mg via INTRAVENOUS
  Filled 2020-04-18: qty 10

## 2020-04-18 MED ORDER — ACETAMINOPHEN 650 MG RE SUPP
650.0000 mg | Freq: Four times a day (QID) | RECTAL | Status: DC | PRN
  Filled 2020-04-18: qty 1

## 2020-04-18 MED ORDER — MIDAZOLAM HCL 2 MG/2ML IJ SOLN
4.0000 mg | Freq: Once | INTRAMUSCULAR | Status: AC
  Administered 2020-04-19: 4 mg via INTRAVENOUS
  Filled 2020-04-18: qty 4

## 2020-04-18 MED ORDER — SODIUM CHLORIDE 0.9 % IV SOLN
100.0000 mg | Freq: Every day | INTRAVENOUS | Status: AC
  Administered 2020-04-18: 100 mg via INTRAVENOUS
  Filled 2020-04-18: qty 100

## 2020-04-18 NOTE — Progress Notes (Signed)
[facility]  ROUNDING NOTE   Subjective:   Patient requiring norepinephrine to stabilize her blood pressure. Currently requiring nonrebreather mask.  Nursing staff reports oxygen desaturation with weaning of oxygen No leg edema Very lethargic.  Not able to answer questions  Objective:  Vital signs in last 24 hours:  Temp:  [96.6 F (35.9 C)-100.4 F (38 C)] 99.4 F (37.4 C) (09/04 0846) Pulse Rate:  [40-113] 96 (09/04 0800) Resp:  [15-47] 26 (09/04 0800) BP: (82-159)/(21-142) 133/49 (09/04 0846) SpO2:  [84 %-98 %] 93 % (09/04 0846) FiO2 (%):  [100 %] 100 % (09/04 0846)  Weight change:  Filed Weights   03/25/2020 1739 03/31/2020 2230  Weight: 81.2 kg 79.8 kg    Intake/Output: I/O last 3 completed shifts: In: 1841.3 [I.V.:1605.8; IV Piggyback:235.5] Out: -400    Intake/Output this shift:  No intake/output data recorded.  Physical Exam: General: Critically ill  Lungs:   Decreased breath sounds at bases HFNC 15L O2  Heart: Regular rate and rhythm, tachycardic  Abdomen:  Soft,   Extremities:  Trace edema  Neurologic:  Lethargic  Skin: No lesions  Access: Right AVF, right temp femoral HD catheter 9/2 Dr. Delana Meyer    Basic Metabolic Panel: Recent Labs  Lab 04/15/20 0505 04/15/20 0505 04/16/20 0335 04/16/20 0335 04/17/20 0356 04/17/20 2157 04/18/20 0403  NA 139  --  140  --  139 143 141  K 4.5  --  4.4  --  4.6 3.8 4.0  CL 95*  --  93*  --  95* 97* 96*  CO2 22  --  21*  --  _0 GLUCOSE 197*  --  180*  --  261* 155* 156*  BUN 85*  --  54*  --  84* 53* 59*  CREATININE 18.58*  --  11.74*  --  13.42* 9.14* 9.86*  CALCIUM 8.2*   < > 8.6*   < > 8.8* 8.7* 8.6*  MG 2.5*  --  2.2  --  2.3 1.8 2.0  PHOS 8.8*  --  7.5*  --  8.4* 5.5* 5.1*   < > = values in this interval not displayed.    Liver Function Tests: Recent Labs  Lab 04/15/20 0505 04/16/20 0335 04/17/20 0356 04/17/20 2157 04/18/20 0403  AST 48* 37 25 27 33  ALT _1 ALKPHOS 59 59 66 70 68  BILITOT 1.6* 2.5* 1.1 1.2 1.3*  PROT 6.6 6.1* 6.4* 6.3* 5.8*  ALBUMIN 2.9* 2.6* 2.5* 2.5* 2.3*   No results for input(s): LIPASE, AMYLASE in the last 168 hours. No results for input(s): AMMONIA in the last 168 hours.  CBC: Recent Labs  Lab 03/18/2020 1742 03/19/2020 1742 04/15/20 0505 04/16/20 0335 04/17/20 0356 04/17/20 2157 04/18/20 0403  WBC 4.2   < > 3.5* 6.1 7.2 11.3* 11.9*  NEUTROABS 3.4  --  2.9 5.2 5.8  --  10.9*  HGB 11.7*   < > 11.9* 11.7* 13.0 13.0 12.3  HCT 35.6*   < > 34.6* 35.5* 37.3 37.9 37.3  MCV 87.5   < > 85.4 88.3 85.0 84.2 86.9  PLT 210   < > 187 260 363 393 344   < > = values in this interval not displayed.    Cardiac Enzymes: No results for input(s): CKTOTAL, CKMB, CKMBINDEX, TROPONINI in the last 168 hours.  BNP: Invalid input(s): POCBNP  CBG: Recent Labs  Lab 04/17/20 1953 04/17/20 2342 04/18/20 0353 04/18/20 0809 04/18/20  [place]    Microbiology: Results for orders placed or performed during the hospital encounter of 03/24/2020  SARS Coronavirus 2 by RT PCR (hospital order, performed in [facility] hospital lab) Nasopharyngeal Nasopharyngeal Swab     Status: Abnormal   Collection Time: 03/23/2020  5:42 PM   Specimen: Nasopharyngeal Swab  Result Value Ref Range Status   SARS Coronavirus 2 POSITIVE (A) NEGATIVE Final    Comment: RESULT CALLED TO, READ BACK BY AND VERIFIED WITH: MAC BROWN 04/07/2020 AT 1935 BY AR (NOTE) SARS-CoV-2 target nucleic acids are DETECTED  SARS-CoV-2 RNA is generally detectable in upper respiratory specimens  during the acute phase of infection.  Positive results are indicative  of the presence of the identified virus, but do not rule out bacterial infection or co-infection with other pathogens not detected by the test.  Clinical correlation with patient history and  other diagnostic information is necessary to determine patient infection status.  The expected  result is negative.  Fact Sheet for Patients:   StrictlyIdeas.no   Fact Sheet for Healthcare Providers:   BankingDealers.co.za    This test is not yet approved or cleared by the [place] FDA and  has been authorized for detection and/or diagnosis of SARS-CoV-2 by FDA under an Emergency Use Authorization (EUA).  This EUA will remain in effect (meaning this test  can be used) for the duration of  the COVID-19 declaration under Section 564(b)(1) of the Act, 21 U.S.C. section 360-bbb-3(b)(1), unless the authorization is terminated or revoked sooner.  Performed at [facility], [address] [place], [place] [address]   Culture, blood (Routine X 2) w Reflex to ID Panel     Status: None (Preliminary result)   Collection Time: 03/28/2020  9:12 PM   Specimen: BLOOD  Result Value Ref Range Status   Specimen Description BLOOD LEFT [facility]  Final   Special Requests   Final    BOTTLES DRAWN AEROBIC AND ANAEROBIC Blood Culture adequate volume   Culture   Final    NO GROWTH 4 DAYS Performed at [facility], [address], [place], [place] [address]    Report Status PENDING  Incomplete  Culture, blood (Routine X 2) w Reflex to ID Panel     Status: None (Preliminary result)   Collection Time: 04/10/2020  9:12 PM   Specimen: BLOOD  Result Value Ref Range Status   Specimen Description BLOOD LEFT [facility]  Final   Special Requests   Final    BOTTLES DRAWN AEROBIC AND ANAEROBIC Blood Culture adequate volume   Culture   Final    NO GROWTH 4 DAYS Performed at [facility], [address], [place], [place] [address]    Report Status PENDING  Incomplete  MRSA PCR Screening     Status: None   Collection Time: 04/15/20 12:15 AM   Specimen: Nasal Mucosa; Nasopharyngeal  Result Value Ref Range Status   MRSA by PCR NEGATIVE NEGATIVE Final    Comment:        The GeneXpert MRSA Assay (FDA approved for NASAL specimens only), is  one component of a comprehensive MRSA colonization surveillance program. It is not intended to diagnose MRSA infection nor to guide or monitor treatment for MRSA infections. Performed at [facility], [address] [place], [place] [address]     Coagulation Studies: No results for input(s): LABPROT, INR in the last 72 hours.  Urinalysis: No results for input(s): COLORURINE,  LABSPEC, PHURINE, GLUCOSEU, HGBUR, BILIRUBINUR, KETONESUR, PROTEINUR, UROBILINOGEN, NITRITE, LEUKOCYTESUR in the last 72 hours.  Invalid input(s): APPERANCEUR    Imaging: DG Chest Port 1 View  Result Date: 04/17/2020 CLINICAL DATA:  Acute respiratory failure with hypoxia EXAM: PORTABLE CHEST 1 VIEW COMPARISON:  04/15/2020 and prior exams FINDINGS: The cardiomediastinal silhouette is unchanged with enlarged cardiopericardial silhouette. Bilateral airspace opacities are again noted and appear slightly decreased on the RIGHT. This is a low volume study.  No pneumothorax. No other interval changes noted. IMPRESSION: Slightly decreased RIGHT airspace opacities otherwise unchanged appearance of the chest. No pneumothorax. Electronically Signed   By: Margarette Canada M.D.   On: 04/17/2020 05:41     Medications:   . sodium chloride    . sodium chloride    . dexmedetomidine (PRECEDEX) IV infusion 0.7 mcg/kg/hr (04/18/20 0852)  . norepinephrine (LEVOPHED) Adult infusion 26 mcg/min (04/18/20 1040)  . remdesivir 100 mg in NS 100 mL     . Chlorhexidine Gluconate Cloth  6 each Topical Daily  . ferric citrate  630 mg Oral TID WC  . folic acid  1 mg Intravenous Daily  . heparin  5,000 Units Subcutaneous Q8H  . insulin aspart  0-6 Units Subcutaneous Q4H  . levothyroxine  50 mcg Oral Daily  . mouth rinse  15 mL Mouth Rinse BID  . methylPREDNISolone (SOLU-MEDROL) injection  40 mg Intravenous Q12H  . pantoprazole (PROTONIX) IV  40 mg Intravenous Q24H  . sodium chloride flush  10-40 mL Intracatheter Q12H  . thiamine  injection  100 mg Intravenous Daily   acetaminophen, albuterol, guaiFENesin-dextromethorphan, morphine injection, ondansetron **OR** ondansetron (ZOFRAN) IV, sodium chloride flush  Assessment/ Plan:  Ms. Anna [name] is a 64 y.o. 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 Right AVF 81.5kg  1. End Stage Renal Disease: Patient underwent hemodialysis on Friday.  400 cc of volume was removed Electrolytes and volume status are acceptable.  No acute indication for dialysis at present.  We will continue to monitor closely  2. Acute respiratory failure with COVID-19 infection: Requiring high levels of oxygen.  -Management as per pulmonary team  3. Anemia of chronic kidney disease:  Lab Results  Component Value Date   HGB 12.3 04/18/2020  Will restart Epogen supplementation for hemoglobin less than 11   4. Secondary Hyperparathyroidism: Lab Results  Component Value Date   CALCIUM 8.6 (L) 04/18/2020   CAION 1.11 (L) 10/26/2011   PHOS 5.1 (H) 04/18/2020   Monitor calcium and phosphorus during this admission    LOS: 4 Anna [name] 9/4/202112:21 PM

## 2020-04-18 NOTE — Progress Notes (Signed)
Maggie, NP notified of patients elevated temperature at 102 axillary. Acknowledged and will enter order.

## 2020-04-18 NOTE — Progress Notes (Signed)
Name: Anna [name] MRN: [id number] DOB: 03-18-1956    ADMISSION DATE:  03/17/2020 CONSULTATION DATE: 03/25/2020  REFERRING MD : Dr. Tamala Julian   CHIEF COMPLAINT: Shortness of Breath   BRIEF PATIENT DESCRIPTION:  64 female with ESRD on hemodialysis admitted with acute hypoxic hypercapnic respiratory failure secondary to pulmonary edema and COVID-19 requiring HFNC  SIGNIFICANT EVENTS/STUDIES:  08/31: Pt admitted to ICU requiring HFNC  08/31: CT Head revealed no acute intracranial abnormality. 04/15/20- patient is receiving HD, She does not have IV access will get midline placed.  04/16/20- patient had uneventful day. Plan for trialysis line placement as per renal team since they do not have staff to cannulate her AV fistula.  Nephrologist asked vascular team to place catheter. 04/17/20- trial intermittent slow HD per nephrology  HISTORY OF PRESENT ILLNESS:   This is a 64 yo with a PMH of Thyrotoxicosis, Morbid Obesity, Malignant HTN, Hyperparathyroidism, Hypercalcemia, Hepatitis C, ESRD, Type II Diabetes Mellitus, Cardiomyopathy, and Anemia.  She presented to [facility] ER on 64 from the dialysis center with O2 sats of 33% on room air.  Per ER notes she was diagnosed with COVID-19 last week and has had shortness of breath/cough.  In the ER COVID-19 positive, and CXR concerning for pulmonary edema/atypical pneumonia.  On call nephrologist contacted by ER physician, and plan is for pt to undergo hemodialysis on  04/15/2020.  Lab results revealed chloride 93, glucose 149, BUN 79, creatinine 17.93, calcium 8.3, anion gap 25, AST 60, BNP 645.4, troponin 324, pct 1.06, hgb 11.7, and pH 7.38/pCO2 43/bicarb 25.4.  She was placed 15L HFNC. She was subsequently admitted to ICU for additional workup and treatment.    PAST MEDICAL HISTORY :   has a past medical history of Anemia, AV fistula ([place]), Cardiomyopathy ([place]), Cardiomyopathy ([place]), Diabetes mellitus type 2 in obese ([place]), ESRD (end stage renal disease)  ([place]), Full dentures, Hepatitis C, Hypercalcemia, Hyperparathyroidism, secondary ([place]), Malignant hypertension, Morbid obesity ([place]), and Thyrotoxicosis.  has a past surgical history that includes AV fistula placement (2013); Incisional hernia repair (2007); AV fistula placement (2008); AV fistula placement (2008); AV fistula repair (2008); Cholecystectomy (2003); Carpal tunnel release (Right, 03/10/2014); Fistulogram (N/A, 10/26/2011); and A/V Fistulagram (N/A, 10/10/2016). Prior to Admission medications   Medication Sig Start Date End Date Taking? Authorizing Provider  amLODipine (NORVASC) 10 MG tablet Take 10 mg by mouth at bedtime. 06/02/19  Yes [provider]  AURYXIA 1 GM 210 MG(Fe) tablet Take 630 mg by mouth 3 (three) times daily with meals.  02/03/20  Yes [provider]  levothyroxine (SYNTHROID, LEVOTHROID) 50 MCG tablet Take 50 mcg by mouth daily. 08/04/16  Yes [provider]  losartan (COZAAR) 50 MG tablet Take 50 mg by mouth at bedtime. 07/04/19  Yes [provider]  ibuprofen (ADVIL,MOTRIN) 800 MG tablet Take 800 mg by mouth every 8 (eight) hours as needed for mild pain.     [provider]  meloxicam (MOBIC) 15 MG tablet Take 1 tablet (15 mg total) by mouth daily. Patient not taking: Reported on 04/05/2020 05/22/16   Victorino Dike, FNP   Allergies  Allergen Reactions  . Latex Itching    LATEX GLOVES    FAMILY HISTORY:  family history is not on file. SOCIAL HISTORY:  reports that she has quit smoking. She has never used smokeless tobacco. She reports that she does not drink alcohol and does not use drugs.  SUBJECTIVE:  Remains HHFNC today  VITAL SIGNS: Temp:  [96.8  F (36 C)-100.4 F (38 C)] 99.9 F (37.7 C) (09/04 1539) Pulse Rate:  [40-113] 101 (09/04 1642) Resp:  [6-47] 27 (09/04 1642) BP: (82-173)/(21-153) 147/49 (09/04 1642) SpO2:  [84 %-96 %] 93 % (09/04 1642) FiO2 (%):  [100 %] 100 % (09/04 1350)  PHYSICAL  EXAMINATION: General: acutely ill appearing female, NAD on HFNC  Neuro: alert, oriented to self and place only, follows commands, PERRLA HEENT: supple, no JVD Cardiovascular: irregular irregular, no R/G  Lungs: faint crackles throughout, even, non labored  Abdomen: +BS x4, obese, soft, non tender, non distended  Musculoskeletal: 1+ bilateral lower extremity edema Skin: RUE av fistula +bruit and thrill  Recent Labs  Lab 04/17/20 0356 04/17/20 2157 04/18/20 0403  NA 139 143 141  K 4.6 3.8 4.0  CL 95* 97* 96*  CO2 25 31 25   BUN 84* 53* 59*  CREATININE 13.42* 9.14* 9.86*  GLUCOSE 261* 155* 156*   Recent Labs  Lab 04/17/20 0356 04/17/20 2157 04/18/20 0403  HGB 13.0 13.0 12.3  HCT 37.3 37.9 37.3  WBC 7.2 11.3* 11.9*  PLT 363 393 344   DG Chest Port 1 View  Result Date: 04/17/2020 CLINICAL DATA:  Acute respiratory failure with hypoxia EXAM: PORTABLE CHEST 1 VIEW COMPARISON:  04/15/2020 and prior exams FINDINGS: The cardiomediastinal silhouette is unchanged with enlarged cardiopericardial silhouette. Bilateral airspace opacities are again noted and appear slightly decreased on the RIGHT. This is a low volume study.  No pneumothorax. No other interval changes noted. IMPRESSION: Slightly decreased RIGHT airspace opacities otherwise unchanged appearance of the chest. No pneumothorax. Electronically Signed   By: Margarette Canada M.D.   On: 04/17/2020 05:41   ECHOCARDIOGRAM COMPLETE  Result Date: 04/18/2020    ECHOCARDIOGRAM REPORT   Patient Name:   Anna [name] Date of Exam: 04/18/2020 Medical Rec #:  [id number]      Height:       61.0 in Accession #:    [id number]     Weight:       175.9 lb Date of Birth:  [birth year]      BSA:          1.789 m Patient Age:    64 years       BP:           135/62 mmHg Patient Gender: F              HR:           107 bpm. Exam Location:  [facility] Procedure: 2D Echo Indications:     Abnormal EKG  History:         Patient has no prior history of Echocardiogram  examinations.                  Risk Factors:ESRD.  Sonographer:     Carlean Jews Thornton-[place]nard Referring Phys:  [id number] LSLHTD [place] Diagnosing Phys: Kate Sable MD IMPRESSIONS  1. Left ventricular ejection fraction, by estimation, is 50 to 55%. The left ventricle has low normal function. The left ventricle has no regional wall motion abnormalities. There is mild left ventricular hypertrophy. Left ventricular diastolic parameters are consistent with Grade II diastolic dysfunction (pseudonormalization).  2. Right ventricular systolic function is normal. The right ventricular size is normal.  3. Left atrial size was moderately dilated.  4. Right atrial size was mildly dilated.  5. The mitral valve is degenerative. Mild mitral valve regurgitation.  6. The aortic valve was not well visualized. Aortic valve regurgitation is not  visualized. Mild aortic valve sclerosis is present, with no evidence of aortic valve stenosis.  7. The inferior vena cava is normal in size with greater than 50% respiratory variability, suggesting right atrial pressure of 3 mmHg. FINDINGS  Left Ventricle: Left ventricular ejection fraction, by estimation, is 50 to 55%. The left ventricle has low normal function. The left ventricle has no regional wall motion abnormalities. The left ventricular internal cavity size was normal in size. There is mild left ventricular hypertrophy. Left ventricular diastolic parameters are consistent with Grade II diastolic dysfunction (pseudonormalization). Right Ventricle: The right ventricular size is normal. No increase in right ventricular wall thickness. Right ventricular systolic function is normal. Left Atrium: Left atrial size was moderately dilated. Right Atrium: Right atrial size was mildly dilated. Pericardium: There is no evidence of pericardial effusion. Mitral Valve: The mitral valve is degenerative in appearance. Mild mitral annular calcification. Mild mitral valve regurgitation. Tricuspid Valve: The  tricuspid valve is normal in structure. Tricuspid valve regurgitation is not demonstrated. Aortic Valve: The aortic valve was not well visualized. Aortic valve regurgitation is not visualized. Mild aortic valve sclerosis is present, with no evidence of aortic valve stenosis. Aortic valve mean gradient measures 15.0 mmHg. Aortic valve peak gradient measures 29.6 mmHg. Aortic valve area, by VTI measures 1.18 cm. Pulmonic Valve: The pulmonic valve was not well visualized. Pulmonic valve regurgitation is not visualized. Aorta: The aortic root is normal in size and structure. Venous: The inferior vena cava is normal in size with greater than 50% respiratory variability, suggesting right atrial pressure of 3 mmHg. IAS/Shunts: No atrial level shunt detected by color flow Doppler.  LEFT VENTRICLE PLAX 2D LVIDd:         4.08 cm  Diastology LVIDs:         2.58 cm  LV e' lateral:   5.19 cm/s LV PW:         1.56 cm  LV E/e' lateral: 16.8 LV IVS:        1.66 cm  LV e' medial:    3.98 cm/s LVOT diam:     2.00 cm  LV E/e' medial:  21.9 LV SV:         41 LV SV Index:   23 LVOT Area:     3.14 cm  RIGHT VENTRICLE RV S prime:     13.20 cm/s TAPSE (M-mode): 1.9 cm LEFT ATRIUM             Index LA diam:        3.70 cm 2.07 cm/m LA Vol (A2C):   80.7 ml 45.12 ml/m LA Vol (A4C):   84.5 ml 47.24 ml/m LA Biplane Vol: 87.4 ml 48.87 ml/m  AORTIC VALVE                    PULMONIC VALVE AV Area (Vmax):    1.19 cm     PV Vmax:       1.40 m/s AV Area (Vmean):   1.28 cm     PV Peak grad:  7.8 mmHg AV Area (VTI):     1.18 cm AV Vmax:           272.00 cm/s AV Vmean:          177.000 cm/s AV VTI:            0.348 m AV Peak Grad:      29.6 mmHg AV Mean Grad:      15.0 mmHg LVOT Vmax:  103.00 cm/s LVOT Vmean:        71.900 cm/s LVOT VTI:          0.131 m LVOT/AV VTI ratio: 0.38  AORTA Ao Root diam: 3.20 cm MITRAL VALVE MV Area (PHT): 4.17 cm    SHUNTS MV Decel Time: 182 msec    Systemic VTI:  0.13 m MV E velocity: 87.00 cm/s  Systemic  Diam: 2.00 cm MV A velocity: 74.10 cm/s MV E/A ratio:  1.17 Kate Sable MD Electronically signed by Kate Sable MD Signature Date/Time: 04/18/2020/12:55:43 PM    Final     ASSESSMENT / PLAN:  Acute hypoxic hypercapnic respiratory failure  COVID-19 Volume overload/pulmonary edema Morbid obesity with COVID-19  Maintain HHFNC or NIV Continue remdesivir Presently on solumedrol 40mg  IV q 12h, will maintain  Trend inflammatory markers for need of baricitinib  Prn bronchodilator therapy Encourage Self-proning  Encourage OOB to chair  Aggressive pulmonary hygiene  Malignant HTN Elevated troponin likely secondary to demand ischemia in the setting of acute respiratory failure  Hx: Cardiomyopathy Continuous telemetry monitoring  Continue outpatient antihypertensives  Troponin peaked 324 on 8/31, now 165  Will check echo, no acute ST segments correlating with peak  ESRD on hemodialysis  Trend BMP  Replace electrolytes as indicated  Avoid nephrotoxic medications D/W nephrology, successful with iHD, K and bicarb are WNL   Anemia without obvious signs of acute blood loss VTE px: subq heparin and SCD's  Trend CBC  Monitor s/sx of bleeding and transfuse hgb <7  Type II diabetes mellitus  CBG's ac/hs SSI   Acute encephalopathy likely secondary to infectious process  Frequent reorientation  Avoid sedating medications   ID  Leukocytosis accruing with bandemia,  will pan-culture and monitor temp (afebrile) Send PCT  Critical care provider statement:    Critical care time (minutes):  36   Critical care time was exclusive of:  Separately billable procedures and  treating other patients   Critical care was necessary to treat or prevent imminent or  life-threatening deterioration of the following conditions:  Acute hypoxemic respiratory failure, COVID19 pneumonia, ESRD, encephalopathy.   Critical care was time spent personally by me on the following  activities:   Development of treatment plan with patient or surrogate,  discussions with consultants, evaluation of patient's response to  treatment, examination of patient, obtaining history from patient or  surrogate, ordering and performing treatments and interventions, ordering  and review of laboratory studies and re-evaluation of patient's condition   Hanley Seamen, MD

## 2020-04-18 NOTE — Progress Notes (Signed)
Maggie, NP notified that oxygen sustaining 83-84% on bubble HFNC and NRB. RT also notified

## 2020-04-19 ENCOUNTER — Inpatient Hospital Stay: Payer: Medicare HMO

## 2020-04-19 ENCOUNTER — Encounter: Payer: Self-pay | Admitting: Pulmonary Disease

## 2020-04-19 DIAGNOSIS — Z01818 Encounter for other preprocedural examination: Secondary | ICD-10-CM

## 2020-04-19 DIAGNOSIS — R778 Other specified abnormalities of plasma proteins: Secondary | ICD-10-CM

## 2020-04-19 DIAGNOSIS — E8729 Other acidosis: Secondary | ICD-10-CM

## 2020-04-19 DIAGNOSIS — J9620 Acute and chronic respiratory failure, unspecified whether with hypoxia or hypercapnia: Secondary | ICD-10-CM

## 2020-04-19 DIAGNOSIS — Z4659 Encounter for fitting and adjustment of other gastrointestinal appliance and device: Secondary | ICD-10-CM

## 2020-04-19 DIAGNOSIS — J9601 Acute respiratory failure with hypoxia: Secondary | ICD-10-CM

## 2020-04-19 DIAGNOSIS — E872 Acidosis: Secondary | ICD-10-CM

## 2020-04-19 LAB — BLOOD GAS, ARTERIAL
Acid-base deficit: 1.7 mmol/L (ref 0.0–2.0)
Bicarbonate: 26.3 mmol/L (ref 20.0–28.0)
FIO2: 100
MECHVT: 450 mL
Mechanical Rate: 24
O2 Saturation: 84 %
PEEP: 10 cmH2O
Patient temperature: 37
pCO2 arterial: 60 mmHg — ABNORMAL HIGH (ref 32.0–48.0)
pH, Arterial: 7.25 — ABNORMAL LOW (ref 7.350–7.450)
pO2, Arterial: 57 mmHg — ABNORMAL LOW (ref 83.0–108.0)

## 2020-04-19 LAB — COMPREHENSIVE METABOLIC PANEL
ALT: 17 U/L (ref 0–44)
AST: 55 U/L — ABNORMAL HIGH (ref 15–41)
Albumin: 2 g/dL — ABNORMAL LOW (ref 3.5–5.0)
Alkaline Phosphatase: 85 U/L (ref 38–126)
Anion gap: 19 — ABNORMAL HIGH (ref 5–15)
BUN: 73 mg/dL — ABNORMAL HIGH (ref 8–23)
CO2: 24 mmol/L (ref 22–32)
Calcium: 8.6 mg/dL — ABNORMAL LOW (ref 8.9–10.3)
Chloride: 102 mmol/L (ref 98–111)
Creatinine, Ser: 11.19 mg/dL — ABNORMAL HIGH (ref 0.44–1.00)
GFR calc Af Amer: 4 mL/min — ABNORMAL LOW (ref >60)
GFR calc non Af Amer: 3 mL/min — ABNORMAL LOW (ref >60)
Glucose, Bld: 127 mg/dL — ABNORMAL HIGH (ref 70–99)
Potassium: 5.1 mmol/L (ref 3.5–5.1)
Sodium: 145 mmol/L (ref 135–145)
Total Bilirubin: 1.2 mg/dL (ref 0.3–1.2)
Total Protein: 5.5 g/dL — ABNORMAL LOW (ref 6.5–8.1)

## 2020-04-19 LAB — CULTURE, BLOOD (ROUTINE X 2)
Culture: NO GROWTH
Culture: NO GROWTH
Special Requests: ADEQUATE
Special Requests: ADEQUATE

## 2020-04-19 LAB — CBC WITH DIFFERENTIAL/PLATELET
Abs Immature Granulocytes: 2 10*3/uL — ABNORMAL HIGH (ref 0.00–0.07)
Band Neutrophils: 43 %
Basophils Absolute: 0 10*3/uL (ref 0.0–0.1)
Basophils Relative: 0 %
Eosinophils Absolute: 0 10*3/uL (ref 0.0–0.5)
Eosinophils Relative: 0 %
HCT: 35.3 % — ABNORMAL LOW (ref 36.0–46.0)
Hemoglobin: 11.4 g/dL — ABNORMAL LOW (ref 12.0–15.0)
Lymphocytes Relative: 5 %
Lymphs Abs: 0.3 10*3/uL — ABNORMAL LOW (ref 0.7–4.0)
MCH: 28.9 pg (ref 26.0–34.0)
MCHC: 32.3 g/dL (ref 30.0–36.0)
MCV: 89.6 fL (ref 80.0–100.0)
Metamyelocytes Relative: 28 %
Monocytes Absolute: 0.1 10*3/uL (ref 0.1–1.0)
Monocytes Relative: 1 %
Myelocytes: 11 %
Neutro Abs: 2.9 10*3/uL (ref 1.7–7.7)
Neutrophils Relative %: 12 %
Platelets: 291 10*3/uL (ref 150–400)
RBC: 3.94 MIL/uL (ref 3.87–5.11)
RDW: 16.7 % — ABNORMAL HIGH (ref 11.5–15.5)
Smear Review: NORMAL
WBC: 5.2 10*3/uL (ref 4.0–10.5)
nRBC: 3.4 % — ABNORMAL HIGH (ref 0.0–0.2)

## 2020-04-19 LAB — BLOOD CULTURE ID PANEL (REFLEXED) - BCID2

## 2020-04-19 LAB — TROPONIN I (HIGH SENSITIVITY)
Troponin I (High Sensitivity): 404 ng/L (ref <18)
Troponin I (High Sensitivity): 440 ng/L (ref <18)

## 2020-04-19 LAB — TSH: TSH: 2.39 u[IU]/mL (ref 0.350–4.500)

## 2020-04-19 LAB — FIBRIN DERIVATIVES D-DIMER (ARMC ONLY): Fibrin derivatives D-dimer (ARMC): 5427.89 ng/mL (FEU) — ABNORMAL HIGH (ref 0.00–499.00)

## 2020-04-19 LAB — MAGNESIUM: Magnesium: 2.3 mg/dL (ref 1.7–2.4)

## 2020-04-19 LAB — PROCALCITONIN: Procalcitonin: 75.41 ng/mL

## 2020-04-19 LAB — PHOSPHORUS: Phosphorus: 6.8 mg/dL — ABNORMAL HIGH (ref 2.5–4.6)

## 2020-04-19 LAB — FERRITIN: Ferritin: 5499 ng/mL — ABNORMAL HIGH (ref 11–307)

## 2020-04-19 LAB — GLUCOSE, CAPILLARY: Glucose-Capillary: 108 mg/dL — ABNORMAL HIGH (ref 70–99)

## 2020-04-19 MED ORDER — HEPARIN SOD (PORK) LOCK FLUSH 100 UNIT/ML IV SOLN
500.0000 [IU] | Freq: Once | INTRAVENOUS | Status: DC
  Filled 2020-04-19: qty 5

## 2020-04-19 MED ORDER — DOCUSATE SODIUM 50 MG/5ML PO LIQD: 100.0000 mg | Freq: Two times a day (BID) | ORAL | Status: DC

## 2020-04-19 MED ORDER — PANTOPRAZOLE SODIUM 40 MG IV SOLR: 40.0000 mg | Freq: Every day | INTRAVENOUS | Status: DC

## 2020-04-19 MED ORDER — FENTANYL 2500MCG IN NS 250ML (10MCG/ML) PREMIX INFUSION
50.0000 ug/h | INTRAVENOUS | Status: DC
  Administered 2020-04-19: 50 ug/h via INTRAVENOUS
  Filled 2020-04-19: qty 250

## 2020-04-19 MED ORDER — ACETAMINOPHEN 10 MG/ML IV SOLN
1000.0000 mg | Freq: Once | INTRAVENOUS | Status: DC
  Filled 2020-04-19: qty 100

## 2020-04-19 MED ORDER — ORAL CARE MOUTH RINSE
15.0000 mL | OROMUCOSAL | Status: DC
  Administered 2020-04-19 (×2): 15 mL via OROMUCOSAL

## 2020-04-19 MED ORDER — AMIODARONE IV BOLUS ONLY 150 MG/100ML
INTRAVENOUS | Status: AC
  Administered 2020-04-19: 150 mg via INTRAVENOUS
  Filled 2020-04-19: qty 100

## 2020-04-19 MED ORDER — PIPERACILLIN-TAZOBACTAM IN DEX 2-0.25 GM/50ML IV SOLN
2.2500 g | Freq: Three times a day (TID) | INTRAVENOUS | Status: DC
  Filled 2020-04-19 (×2): qty 50

## 2020-04-19 MED ORDER — LACTATED RINGERS IV BOLUS
250.0000 mL | Freq: Once | INTRAVENOUS | Status: AC
  Administered 2020-04-19: 250 mL via INTRAVENOUS

## 2020-04-19 MED ORDER — MIDAZOLAM HCL 2 MG/2ML IJ SOLN
2.0000 mg | INTRAMUSCULAR | Status: DC | PRN
  Administered 2020-04-19: 2 mg via INTRAVENOUS
  Filled 2020-04-19: qty 2

## 2020-04-19 MED ORDER — MIDAZOLAM HCL 2 MG/2ML IJ SOLN: 2.0000 mg | INTRAMUSCULAR | Status: DC | PRN

## 2020-04-19 MED ORDER — PHENYLEPHRINE CONCENTRATED 100MG/250ML (0.4 MG/ML) INFUSION SIMPLE
0.0000 ug/min | INTRAVENOUS | Status: DC
  Administered 2020-04-19: 50 ug/min via INTRAVENOUS
  Filled 2020-04-19: qty 250

## 2020-04-19 MED ORDER — CHLORHEXIDINE GLUCONATE 0.12% ORAL RINSE (MEDLINE KIT)
15.0000 mL | Freq: Two times a day (BID) | OROMUCOSAL | Status: DC
  Administered 2020-04-19: 15 mL via OROMUCOSAL

## 2020-04-19 MED ORDER — FENTANYL CITRATE (PF) 100 MCG/2ML IJ SOLN: 100.0000 ug | Freq: Once | INTRAMUSCULAR | Status: DC

## 2020-04-19 MED ORDER — IPRATROPIUM-ALBUTEROL 0.5-2.5 (3) MG/3ML IN SOLN: 3.0000 mL | Freq: Four times a day (QID) | RESPIRATORY_TRACT | Status: DC

## 2020-04-19 MED ORDER — VECURONIUM BROMIDE 10 MG IV SOLR
10.0000 mg | INTRAVENOUS | Status: DC | PRN
  Administered 2020-04-19: 10 mg via INTRAVENOUS
  Filled 2020-04-19: qty 10

## 2020-04-19 MED ORDER — FENTANYL CITRATE (PF) 100 MCG/2ML IJ SOLN
50.0000 ug | Freq: Once | INTRAMUSCULAR | Status: DC
  Filled 2020-04-19: qty 2

## 2020-04-19 MED ORDER — AMIODARONE IV BOLUS ONLY 150 MG/100ML: 150.0000 mg | Freq: Once | INTRAVENOUS | Status: AC

## 2020-04-19 MED ORDER — PIPERACILLIN-TAZOBACTAM 3.375 G IVPB
3.3750 g | Freq: Once | INTRAVENOUS | Status: AC
  Administered 2020-04-19: 3.375 g via INTRAVENOUS
  Filled 2020-04-19: qty 50

## 2020-04-19 MED ORDER — FENTANYL BOLUS VIA INFUSION
50.0000 ug | INTRAVENOUS | Status: DC | PRN
  Filled 2020-04-19: qty 50

## 2020-04-19 MED ORDER — POLYETHYLENE GLYCOL 3350 17 G PO PACK: 17.0000 g | PACK | Freq: Every day | ORAL | Status: DC

## 2020-04-19 MED FILL — Medication: Qty: 1 | Status: AC

## 2020-04-20 MED FILL — Phenylephrine HCl IV Soln 10 MG/ML: INTRAVENOUS | Qty: 10 | Status: AC

## 2020-04-20 MED FILL — Sodium Chloride IV Soln 0.9%: INTRAVENOUS | Qty: 250 | Status: AC

## 2020-04-21 LAB — CULTURE, BLOOD (ROUTINE X 2): Special Requests: ADEQUATE

## 2020-05-15 NOTE — Progress Notes (Signed)
Code blue called at 0419, response from ER, NP and elink. Patient coded for 17 minutes, ROSC was never obtained. Code ended at 0436 by NP. Family notified and brought to the bedside.

## 2020-05-15 NOTE — Progress Notes (Signed)
Pharmacy Antibiotic Note  Anna [name] is a 64 y.o. female admitted on 03/28/2020 with pneumonia.  Pharmacy has been consulted for Zosyn dosing.  Plan: Zosyn 3.375gm x 1 now then 2.25gm IV q8hrs (renally adjusted)  Height: 5\' 1"  (154.9 cm) Weight: 79.8 kg (175 lb 14.8 oz) IBW/kg (Calculated) : 47.8  Temp (24hrs), Avg:100.4 F (38 C), Min:99.4 F (37.4 C), Max:102 F (38.9 C)  Recent Labs  Lab 03/21/2020 1742 04/11/2020 1742 04/07/2020 2059 04/15/20 0505 04/16/20 0335 04/17/20 0356 04/17/20 2157 04/18/20 0403  WBC 4.2   < >  --  3.5* 6.1 7.2 11.3* 11.9*  CREATININE 17.93*   < >  --  18.58* 11.74* 13.42* 9.14* 9.86*  LATICACIDVEN 1.9  --  1.2  --   --   --   --   --    < > = values in this interval not displayed.    Estimated Creatinine Clearance: 5.5 mL/min (A) (by C-G formula based on SCr of 9.86 mg/dL (H)).    Allergies  Allergen Reactions  . Latex Itching    LATEX GLOVES    Antimicrobials this admission:   >>    >>   Dose adjustments this admission:   Microbiology results:  BCx:   UCx:    Sputum:    MRSA PCR:   Thank you for allowing pharmacy to be a part of this patient's care.  Hart Robinsons A 04-26-2020 1:38 AM

## 2020-05-15 NOTE — Procedures (Addendum)
Intubation Procedure Note  Anna [name]  [id number]  February 13, 1956  Date:04-27-2020  Time:12:05 AM   Provider Performing:Magddalene S Tukov-Yual    Procedure: Intubation (31500)  Indication(s) Respiratory Failure due to COVID-19 infection  Consent Unable to obtain consent due to emergent nature of procedure.   Anesthesia Etomidate, Versed, Fentanyl and Vecuronium   Time Out Verified patient identification, verified procedure, site/side was marked, verified correct patient position, special equipment/implants available, medications/allergies/relevant history reviewed, required imaging and test results available.   Sterile Technique Usual hand hygeine, masks, and gloves were used   Procedure Description Patient positioned in bed supine.  Sedation given as noted above.  Patient was intubated with endotracheal tube using Glidescope.  View was Grade 2 only posterior commissure .  Number of attempts was 1.  Colorimetric CO2 detector was consistent with tracheal placement.   Complications/Tolerance None; patient tolerated the procedure well. Chest X-ray is ordered to verify placement. X-ray recommends tube retraction by 5cm-Done, Awaiting confirmation by x-ray   EBL Minimal  Copius amounts of secretions with blood seen in patient's airway. Some of it suctioned out   Specimen(s) None Procedure performed under direct supervision of Dr.Brescia     Anna [name] S. [facility] ANP-BC Pulmonary and Critical Care Medicine [facility] Pager [phone] or [phone]  NB: This document was prepared using Dragon voice recognition software and may include unintentional dictation errors.

## 2020-05-15 NOTE — Procedures (Addendum)
Arterial Catheter Insertion Procedure Note  Anna [name]  [id number]  [birth year]  Date:05/13/2020  Time:2:40 AM    Provider Performing: Delight Hoh Tukov-Yual    Procedure: Insertion of Arterial Line [phone]) with US guidance (48185)   Indication(s) Blood pressure monitoring and/or need for frequent ABGs  Consent Unable to obtain consent due to emergent nature of procedure.  Anesthesia None   Time Out Verified patient identification, verified procedure, site/side was marked, verified correct patient position, special equipment/implants available, medications/allergies/relevant history reviewed, required imaging and test results available.   Sterile Technique Maximal sterile technique including full sterile barrier drape, hand hygiene, sterile gown, sterile gloves, mask, hair covering, sterile ultrasound probe cover (if used).   Procedure Description Area of catheter insertion was cleaned with chlorhexidine and draped in sterile fashion. With real-time ultrasound guidance an arterial catheter was placed into the right femoral artery.  Appropriate arterial tracings confirmed on monitor.     Complications/Tolerance None; patient tolerated the procedure well.   EBL Minimal   Specimen(s) ABG  Procedure performed under direct supervision of Dr.Bresca. Ultrasound utilized for realtime vessel cannulation   Anna [name] S. [facility] ANP-BC Pulmonary and Critical Care Medicine [facility] Pager [phone] or [phone]  NB: This document was prepared using Dragon voice recognition software and may include unintentional dictation errors.

## 2020-05-15 NOTE — Progress Notes (Signed)
eLink Physician-Brief Progress Note Patient Name: Anna [name] DOB: 28-Sep-1955 MRN: [id number]   Date of Service  04/23/20  HPI/Events of Note  I camera'd in to patient's room in response to code blue announcement, CPR was in progress, directed by Boris Lown, arrest rhythm was asystole, despite more than 35 minutes of ACLS protocol the patient never re-established a rhythm so the code was called by Boris Lown.  eICU Interventions  Code observation / assisted Boris Lown with management.        Kerry Kass Bailyn Spackman 04/23/2020, 4:50 AM

## 2020-05-15 NOTE — Progress Notes (Signed)
Patient intubated with 8.0 ETT, 26 at the lip and OG places at 65. Xray notified for verification of lines.

## 2020-05-15 NOTE — Death Summary Note (Signed)
DEATH SUMMARY   Patient Details  Name: JADIA CAPERS MRN: [id number] DOB: [birth year]  Admission/Discharge Information   Admit Date:  04/20/2020  Date of Death: Date of Death: 25-Apr-2020  Time of Death: Time of Death: 0436  Length of Stay: 5  Referring Physician: Marval Regal, NP   Reason(s) for Hospitalization  COVID 19 pneumonia  Diagnoses  Preliminary cause of death: COVID 40 PNEUMONIA, ARDS Secondary Diagnoses (including complications and co-morbidities):  Active Problems:   Encounter for nasogastric (NG) tube placement   Acute respiratory failure with hypoxia (HCC)   COVID-19   High anion gap metabolic acidosis   Troponin I above reference range   Brief Hospital Course (including significant findings, care, treatment, and services provided and events leading to death)  BRIEF PATIENT DESCRIPTION:  61 female with ESRD on hemodialysis admitted with acute hypoxic hypercapnic respiratory failure secondary to pulmonary edema and COVID-19 requiring HFNC  SIGNIFICANT EVENTS/STUDIES:  04/21/2023: Pt admitted to ICU requiring HFNC  Apr 21, 2023: CT Head revealed no acute intracranial abnormality. 04/15/20- patient is receiving HD, She does not have IV access will get midline placed.  04/16/20- patient had uneventful day. Plan for trialysis line placement as per renal team since they do not have staff to cannulate her AV fistula.  Nephrologist asked vascular team to place catheter. 04/17/20- trial intermittent slow HD per nephrology 04/18/20-Became obtunded and severely hypoxic requiring mechanical ventilation. CXR post-intubation show  new Moderate-sized left pneumothorax and ETT in right main bronchus   Code blue called at 0419, response from ER, NP and elink. Patient coded for 17 minutes, ROSC was never obtained. Code ended at 0436 by NP. Family notified and brought to the bedside.  Pertinent Labs and Studies  Significant Diagnostic Studies DG Chest 1 View  Result Date:  20-Apr-2020 CLINICAL DATA:  Shortness of breath.  Dialysis patient. EXAM: CHEST  1 VIEW COMPARISON:  07/31/2019 FINDINGS: Extensive support apparatus artifact projecting over the right apex. Patient rotated minimally right. Moderate to marked enlargement of the cardiopericardial silhouette. Suspect small bilateral pleural effusions. No pneumothorax. Relatively diffuse interstitial and airspace disease, with relative sparing of the apices. IMPRESSION: Cardiomegaly with diffuse interstitial and airspace disease. Given clinical history, most likely due to pulmonary edema. Atypical infection or aspiration could look similar. Suspicion of layering bilateral pleural effusions. Electronically Signed   By: Abigail Miyamoto M.D.   On: April 20, 2020 18:13   DG Abd 1 View  Result Date: 25-Apr-2020 CLINICAL DATA:  NG tube placement EXAM: ABDOMEN - 1 VIEW COMPARISON:  None. FINDINGS: NG tube is in the stomach. Left pneumothorax seen at the left lung base. Airspace disease throughout the lungs. IMPRESSION: NG tube in the stomach. Moderate-sized left pneumothorax. Critical Value/emergent results were called by telephone at the time of interpretation on 04/25/20 at 1:57 am to nurse Mordecai Maes , who verbally acknowledged these results. Electronically Signed   By: Rolm Baptise M.D.   On: 04-25-2020 02:00   CT HEAD WO CONTRAST  Result Date: 2020/04/20 CLINICAL DATA:  Mental status change EXAM: CT HEAD WITHOUT CONTRAST TECHNIQUE: Contiguous axial images were obtained from the base of the skull through the vertex without intravenous contrast. COMPARISON:  None. FINDINGS: Brain: No evidence of acute territorial infarction, hemorrhage, hydrocephalus,extra-axial collection or mass lesion/mass effect. Normal gray-white differentiation. Ventricles are normal in size and contour. Vascular: No hyperdense vessel or unexpected calcification. Skull: The skull is intact. No fracture or focal lesion identified. Sinuses/Orbits: The visualized paranasal  sinuses and mastoid air cells are  clear. The orbits and globes intact. Other: None IMPRESSION: No acute intracranial abnormality. Electronically Signed   By: Prudencio Pair M.D.   On: 04/03/2020 21:38   DG Chest Port 1 View  Result Date: April 25, 2020 CLINICAL DATA:  Intubation EXAM: PORTABLE CHEST 1 VIEW COMPARISON:  April 25, 2020 FINDINGS: Endotracheal tube has been retracted and is now approximately 2 cm above the carina. Moderate-sized left pneumothorax again noted. Patchy bilateral airspace disease. Heart is borderline in size. IMPRESSION: Endotracheal tube now 2 cm above the carina. Stable moderate right pneumothorax and bilateral airspace disease. Electronically Signed   By: Rolm Baptise M.D.   On: 04/25/20 03:40   Portable Chest x-ray  Result Date: 25-Apr-2020 CLINICAL DATA:  Intubation EXAM: PORTABLE CHEST 1 VIEW COMPARISON:  04/17/2020 FINDINGS: Endotracheal tube is in the right mainstem bronchus. Recommend retracting approximately 5 cm. Moderate-sized left pneumothorax, new since prior study, approximately 20 to 25%. Patchy bilateral airspace disease, somewhat improved since prior study. IMPRESSION: New moderate-sized left pneumothorax. Right mainstem intubation. Recommend retracting the endotracheal tube approximately 5 cm. Critical Value/emergent results were called by telephone at the time of interpretation on 04-25-2020 at 2:02 am to nurse Mordecai Maes , who verbally acknowledged these results. Electronically Signed   By: Rolm Baptise M.D.   On: 2020-04-25 02:03   DG Chest Port 1 View  Result Date: 04/17/2020 CLINICAL DATA:  Acute respiratory failure with hypoxia EXAM: PORTABLE CHEST 1 VIEW COMPARISON:  04/15/2020 and prior exams FINDINGS: The cardiomediastinal silhouette is unchanged with enlarged cardiopericardial silhouette. Bilateral airspace opacities are again noted and appear slightly decreased on the RIGHT. This is a low volume study.  No pneumothorax. No other interval changes noted. IMPRESSION:  Slightly decreased RIGHT airspace opacities otherwise unchanged appearance of the chest. No pneumothorax. Electronically Signed   By: Margarette Canada M.D.   On: 04/17/2020 05:41   Portable chest 1 View  Result Date: 04/15/2020 CLINICAL DATA:  Shortness of breath.  COVID positive. EXAM: PORTABLE CHEST 1 VIEW COMPARISON:  03/20/2020 FINDINGS: 0448 hours. Diffuse bilateral airspace disease again noted, right greater than left and mildly progressive in the interval. The cardio pericardial silhouette is enlarged. The visualized bony structures of the thorax show now acute abnormality. Telemetry leads overlie the chest. IMPRESSION: Interval progression of diffuse asymmetric bilateral airspace disease, right greater than left, now appearing more confluent. No definite evidence of pleural effusion. Electronically Signed   By: Misty Stanley M.D.   On: 04/15/2020 04:59   ECHOCARDIOGRAM COMPLETE  Result Date: 04/18/2020    ECHOCARDIOGRAM REPORT   Patient Name:   EUPHA LOBB Date of Exam: 04/18/2020 Medical Rec #:  [id number]      Height:       61.0 in Accession #:    [id number]     Weight:       175.9 lb Date of Birth:  Jun 03, 1956      BSA:          1.789 m Patient Age:    63 years       BP:           135/62 mmHg Patient Gender: F              HR:           107 bpm. Exam Location:  [facility] Procedure: 2D Echo Indications:     Abnormal EKG  History:         Patient has no prior history of Echocardiogram examinations.  Risk Factors:ESRD.  Sonographer:     Carlean Jews Thornton-Maynard Referring Phys:  [id number] BRESCIA Diagnosing Phys: Kate Sable MD IMPRESSIONS  1. Left ventricular ejection fraction, by estimation, is 50 to 55%. The left ventricle has low normal function. The left ventricle has no regional wall motion abnormalities. There is mild left ventricular hypertrophy. Left ventricular diastolic parameters are consistent with Grade II diastolic dysfunction (pseudonormalization).  2. Right ventricular  systolic function is normal. The right ventricular size is normal.  3. Left atrial size was moderately dilated.  4. Right atrial size was mildly dilated.  5. The mitral valve is degenerative. Mild mitral valve regurgitation.  6. The aortic valve was not well visualized. Aortic valve regurgitation is not visualized. Mild aortic valve sclerosis is present, with no evidence of aortic valve stenosis.  7. The inferior vena cava is normal in size with greater than 50% respiratory variability, suggesting right atrial pressure of 3 mmHg. FINDINGS  Left Ventricle: Left ventricular ejection fraction, by estimation, is 50 to 55%. The left ventricle has low normal function. The left ventricle has no regional wall motion abnormalities. The left ventricular internal cavity size was normal in size. There is mild left ventricular hypertrophy. Left ventricular diastolic parameters are consistent with Grade II diastolic dysfunction (pseudonormalization). Right Ventricle: The right ventricular size is normal. No increase in right ventricular wall thickness. Right ventricular systolic function is normal. Left Atrium: Left atrial size was moderately dilated. Right Atrium: Right atrial size was mildly dilated. Pericardium: There is no evidence of pericardial effusion. Mitral Valve: The mitral valve is degenerative in appearance. Mild mitral annular calcification. Mild mitral valve regurgitation. Tricuspid Valve: The tricuspid valve is normal in structure. Tricuspid valve regurgitation is not demonstrated. Aortic Valve: The aortic valve was not well visualized. Aortic valve regurgitation is not visualized. Mild aortic valve sclerosis is present, with no evidence of aortic valve stenosis. Aortic valve mean gradient measures 15.0 mmHg. Aortic valve peak gradient measures 29.6 mmHg. Aortic valve area, by VTI measures 1.18 cm. Pulmonic Valve: The pulmonic valve was not well visualized. Pulmonic valve regurgitation is not visualized. Aorta:  The aortic root is normal in size and structure. Venous: The inferior vena cava is normal in size with greater than 50% respiratory variability, suggesting right atrial pressure of 3 mmHg. IAS/Shunts: No atrial level shunt detected by color flow Doppler.  LEFT VENTRICLE PLAX 2D LVIDd:         4.08 cm  Diastology LVIDs:         2.58 cm  LV e' lateral:   5.19 cm/s LV PW:         1.56 cm  LV E/e' lateral: 16.8 LV IVS:        1.66 cm  LV e' medial:    3.98 cm/s LVOT diam:     2.00 cm  LV E/e' medial:  21.9 LV SV:         41 LV SV Index:   23 LVOT Area:     3.14 cm  RIGHT VENTRICLE RV S prime:     13.20 cm/s TAPSE (M-mode): 1.9 cm LEFT ATRIUM             Index LA diam:        3.70 cm 2.07 cm/m LA Vol (A2C):   80.7 ml 45.12 ml/m LA Vol (A4C):   84.5 ml 47.24 ml/m LA Biplane Vol: 87.4 ml 48.87 ml/m  AORTIC VALVE  PULMONIC VALVE AV Area (Vmax):    1.19 cm     PV Vmax:       1.40 m/s AV Area (Vmean):   1.28 cm     PV Peak grad:  7.8 mmHg AV Area (VTI):     1.18 cm AV Vmax:           272.00 cm/s AV Vmean:          177.000 cm/s AV VTI:            0.348 m AV Peak Grad:      29.6 mmHg AV Mean Grad:      15.0 mmHg LVOT Vmax:         103.00 cm/s LVOT Vmean:        71.900 cm/s LVOT VTI:          0.131 m LVOT/AV VTI ratio: 0.38  AORTA Ao Root diam: 3.20 cm MITRAL VALVE MV Area (PHT): 4.17 cm    SHUNTS MV Decel Time: 182 msec    Systemic VTI:  0.13 m MV E velocity: 87.00 cm/s  Systemic Diam: 2.00 cm MV A velocity: 74.10 cm/s MV E/A ratio:  1.17 Kate Sable MD Electronically signed by Kate Sable MD Signature Date/Time: 04/18/2020/12:55:43 PM    Final     Microbiology Recent Results (from the past 240 hour(s))  SARS Coronavirus 2 by RT PCR (hospital order, performed in [place] hospital lab) Nasopharyngeal Nasopharyngeal Swab     Status: Abnormal   Collection Time: 04/06/2020  5:42 PM   Specimen: Nasopharyngeal Swab  Result Value Ref Range Status   SARS Coronavirus 2 POSITIVE (A) NEGATIVE  Final    Comment: RESULT CALLED TO, READ BACK BY AND VERIFIED WITH: MAC BROWN 03/18/2020 AT 1935 BY AR (NOTE) SARS-CoV-2 target nucleic acids are DETECTED  SARS-CoV-2 RNA is generally detectable in upper respiratory specimens  during the acute phase of infection.  Positive results are indicative  of the presence of the identified virus, but do not rule out bacterial infection or co-infection with other pathogens not detected by the test.  Clinical correlation with patient history and  other diagnostic information is necessary to determine patient infection status.  The expected result is negative.  Fact Sheet for Patients:   StrictlyIdeas.no   Fact Sheet for Healthcare Providers:   BankingDealers.co.za    This test is not yet approved or cleared by the [place] FDA and  has been authorized for detection and/or diagnosis of SARS-CoV-2 by FDA under an Emergency Use Authorization (EUA).  This EUA will remain in effect (meaning this test  can be used) for the duration of  the COVID-19 declaration under Section 564(b)(1) of the Act, 21 U.S.C. section 360-bbb-3(b)(1), unless the authorization is terminated or revoked sooner.  Performed at [facility], [address] [place], [place] [address]   Culture, blood (Routine X 2) w Reflex to ID Panel     Status: None   Collection Time: 04/10/2020  9:12 PM   Specimen: BLOOD  Result Value Ref Range Status   Specimen Description BLOOD LEFT [facility]  Final   Special Requests   Final    BOTTLES DRAWN AEROBIC AND ANAEROBIC Blood Culture adequate volume   Culture   Final    NO GROWTH 5 DAYS Performed at [facility], [address] [place], [place] [address]    Report Status Apr 28, 2020 FINAL  Final  Culture, blood (Routine X 2) w Reflex to ID Panel  Status: None   Collection Time: 03/29/2020  9:12 PM   Specimen: BLOOD  Result Value Ref Range Status   Specimen Description BLOOD  LEFT AC  Final   Special Requests   Final    BOTTLES DRAWN AEROBIC AND ANAEROBIC Blood Culture adequate volume   Culture   Final    NO GROWTH 5 DAYS Performed at [facility], [address] [place], [place] [address]    Report Status 2020/04/30 FINAL  Final  MRSA PCR Screening     Status: None   Collection Time: 04/15/20 12:15 AM   Specimen: Nasal Mucosa; Nasopharyngeal  Result Value Ref Range Status   MRSA by PCR NEGATIVE NEGATIVE Final    Comment:        The GeneXpert MRSA Assay (FDA approved for NASAL specimens only), is one component of a comprehensive MRSA colonization surveillance program. It is not intended to diagnose MRSA infection nor to guide or monitor treatment for MRSA infections. Performed at [facility], [address] [place], [place] [address]   CULTURE, BLOOD (ROUTINE X 2) w Reflex to ID Panel     Status: None (Preliminary result)   Collection Time: 04/18/20  5:32 PM   Specimen: BLOOD  Result Value Ref Range Status   Specimen Description BLOOD BLOOD RIGHT HAND  Final   Special Requests   Final    BOTTLES DRAWN AEROBIC ONLY Blood Culture adequate volume   Culture  Setup Time   Final    Organism ID to follow GRAM POSITIVE COCCI AEROBIC BOTTLE ONLY CRITICAL RESULT CALLED TO, READ BACK BY AND VERIFIED WITH: Performed at [facility], [address] [place], [place] [address]    Culture [facility] POSITIVE COCCI  Final   Report Status PENDING  Incomplete    Lab Basic Metabolic Panel: Recent Labs  Lab 04/16/20 0335 04/17/20 0356 04/17/20 2157 04/18/20 0403 04/30/2020 0351  NA 140 139 143 141 145  K 4.4 4.6 3.8 4.0 5.1  CL 93* 95* 97* 96* 102  CO2 21* _0 GLUCOSE 180* 261* 155* 156* 127*  BUN 54* 84* 53* 59* 73*  CREATININE 11.74* 13.42* 9.14* 9.86* 11.19*  CALCIUM 8.6* 8.8* 8.7* 8.6* 8.6*  MG 2.2 2.3 1.8 2.0 2.3  PHOS 7.5* 8.4* 5.5* 5.1* 6.8*   Liver Function Tests: Recent Labs  Lab 04/16/20 0335  04/17/20 0356 04/17/20 2157 04/18/20 0403 30-Apr-2020 0351  AST 37 25 27 33 55*  ALT _1 ALKPHOS 59 66 70 68 85  BILITOT 2.5* 1.1 1.2 1.3* 1.2  PROT 6.1* 6.4* 6.3* 5.8* 5.5*  ALBUMIN 2.6* 2.5* 2.5* 2.3* 2.0*   No results for input(s): LIPASE, AMYLASE in the last 168 hours. No results for input(s): AMMONIA in the last 168 hours. CBC: Recent Labs  Lab 04/15/20 0505 04/15/20 0505 04/16/20 0335 04/17/20 0356 04/17/20 2157 04/18/20 0403 30-Apr-2020 0351  WBC 3.5*   < > 6.1 7.2 11.3* 11.9* 5.2  NEUTROABS 2.9  --  5.2 5.8  --  10.9* 2.9  HGB 11.9*   < > 11.7* 13.0 13.0 12.3 11.4*  HCT 34.6*   < > 35.5* 37.3 37.9 37.3 35.3*  MCV 85.4   < > 88.3 85.0 84.2 86.9 89.6  PLT 187   < > 260 363 393 344 291   < > = values in this interval not displayed.   Cardiac Enzymes: No results for input(s): CKTOTAL, CKMB, CKMBINDEX, TROPONINI in the last  168 hours. Sepsis Labs: Recent Labs  Lab 04/02/2020 1742 03/18/2020 2059 04/15/20 0505 04/17/20 0356 04/17/20 2157 04/18/20 0403 04-21-2020 0351  PROCALCITON 1.06  --   --   --   --   --  75.41  WBC 4.2  --    < > 7.2 11.3* 11.9* 5.2  LATICACIDVEN 1.9 1.2  --   --   --   --   --    < > = values in this interval not displayed.     Flora Lipps April 21, 2020, 7:35 AM

## 2020-05-15 NOTE — Code Documentation (Signed)
Called to the bedside for asystole.  Briefly patient is a 64 year old female with end-stage renal disease who was admitted with Covid pneumonia, developed severe ARDS and was intubated.  Post intubation, patient went into refractory shock requiring multiple pressors.  She was also afebrile and tachycardic with heart rate in the 120s and 130s.  Suddenly patient dropped her heart rate to 0.  CPR was initiated immediately and continued for 15 minutes with no return of spontaneous circulation..  Given futility and profuse bleeding orally from ET tube and from ET tube, a unanimous decision was arrived to stop resuscitation.  Patient received a total of 5 doses of epinephrine and 1 dose of bicarb.  Dr. Lucile Shutters from [place] present throughout the code.  Dr. Merrilee Jansky covering attending notified of patient's change in condition. Family notified during code and post code.  Support provided.  Chaplain at bedside with family.   Felix Pratt S. [facility] ANP-BC Pulmonary and Critical Care Medicine [facility] Pager [phone] or [phone]  NB: This document was prepared using Dragon voice recognition software and may include unintentional dictation errors.

## 2020-05-15 NOTE — Progress Notes (Addendum)
Name: Anna [name] MRN: [id number] DOB: Mar 16, 1956    ADMISSION DATE:  04/13/2020 CONSULTATION DATE: 03/23/2020  REFERRING MD : Dr. Tamala Julian   CHIEF COMPLAINT: Shortness of Breath   BRIEF PATIENT DESCRIPTION:  77 female with ESRD on hemodialysis admitted with acute hypoxic hypercapnic respiratory failure secondary to pulmonary edema and COVID-19 requiring HFNC  SIGNIFICANT EVENTS/STUDIES:  08/31: Pt admitted to ICU requiring HFNC  08/31: CT Head revealed no acute intracranial abnormality. 04/15/20- patient is receiving HD, She does not have IV access will get midline placed.  04/16/20- patient had uneventful day. Plan for trialysis line placement as per renal team since they do not have staff to cannulate her AV fistula.  Nephrologist asked vascular team to place catheter. 04/17/20- trial intermittent slow HD per nephrology 04/18/20-Became obtunded and severely hypoxic requiring mechanical ventilation. CXR post-intubation show  new Moderate-sized left pneumothorax and ETT in right main bronchus  HISTORY OF PRESENT ILLNESS:   This is a 64 yo with a PMH of Thyrotoxicosis, Morbid Obesity, Malignant HTN, Hyperparathyroidism, Hypercalcemia, Hepatitis C, ESRD, Type II Diabetes Mellitus, Cardiomyopathy, and Anemia.  She presented to [facility] ER on 08/31 from the dialysis center with O2 sats of 33% on room air.  Per ER notes she was diagnosed with COVID-19 last week and has had shortness of breath/cough.  In the ER COVID-19 positive, and CXR concerning for pulmonary edema/atypical pneumonia.  On call nephrologist contacted by ER physician, and plan is for pt to undergo hemodialysis on  04/15/2020.  Lab results revealed chloride 93, glucose 149, BUN 79, creatinine 17.93, calcium 8.3, anion gap 25, AST 60, BNP 645.4, troponin 324, pct 1.06, hgb 11.7, and pH 7.38/pCO2 43/bicarb 25.4.  She was placed 15L HFNC. She was subsequently admitted to ICU for additional workup and treatment.   SUBJECTIVE: Overnight, patient  became obtunded and severely hypoxic requiring mechanical ventilation. CXR post-intubation show  new moderate-sized left pneumothorax and ETT in right main bronchus. ETT retracted 5cm and repeat CXR show optimal ETT position.  Copious secretions and mild bleeding noted in patient's airway during intubation  PAST MEDICAL HISTORY :   has a past medical history of Anemia, AV fistula ([place]), Cardiomyopathy ([place]), Cardiomyopathy ([place]), Diabetes mellitus type 2 in obese ([place]), ESRD (end stage renal disease) ([place]), Full dentures, Hepatitis C, Hypercalcemia, Hyperparathyroidism, secondary ([place]), Malignant hypertension, Morbid obesity ([place]), and Thyrotoxicosis.  has a past surgical history that includes AV fistula placement (2013); Incisional hernia repair (2007); AV fistula placement (2008); AV fistula placement (2008); AV fistula repair (2008); Cholecystectomy (2003); Carpal tunnel release (Right, 03/10/2014); Fistulogram (N/A, 10/26/2011); and A/V Fistulagram (N/A, 10/10/2016). Prior to Admission medications   Medication Sig Start Date End Date Taking? Authorizing Provider  amLODipine (NORVASC) 10 MG tablet Take 10 mg by mouth at bedtime. 06/02/19  Yes [provider]  AURYXIA 1 GM 210 MG(Fe) tablet Take 630 mg by mouth 3 (three) times daily with meals.  02/03/20  Yes [provider]  levothyroxine (SYNTHROID, LEVOTHROID) 50 MCG tablet Take 50 mcg by mouth daily. 08/04/16  Yes [provider]  losartan (COZAAR) 50 MG tablet Take 50 mg by mouth at bedtime. 07/04/19  Yes [provider]  ibuprofen (ADVIL,MOTRIN) 800 MG tablet Take 800 mg by mouth every 8 (eight) hours as needed for mild pain.     [provider]  meloxicam (MOBIC) 15 MG tablet Take 1 tablet (15 mg total) by mouth daily. Patient not taking: Reported on 03/29/2020 05/22/16   Sherrie George  B, FNP   Allergies  Allergen Reactions  . Latex Itching    LATEX GLOVES    FAMILY HISTORY:  family history is  not on file. SOCIAL HISTORY:  reports that she has quit smoking. She has never used smokeless tobacco. She reports that she does not drink alcohol and does not use drugs.   VITAL SIGNS: Temp:  [99.4 F (37.4 C)-102 F (38.9 C)] 102 F (38.9 C) (09/04 2150) Pulse Rate:  [42-107] 91 (09/04 2330) Resp:  [6-35] 29 (09/04 2330) BP: (70-173)/(21-153) 128/62 (09/04 2330) SpO2:  [83 %-96 %] 85 % (09/04 2330) FiO2 (%):  [100 %] 100 % (09/05 0225)  PHYSICAL EXAMINATION: General: acutely ill appearing female, unresponsive Neuro: Withdraws to noxious stimulus, PERRLA, corneal reflexes intact HEENT: supple, no JVD Cardiovascular: irregular irregular, no R/G  Lungs: faint crackles throughout, even, non labored  Abdomen: +BS x4, obese, soft, non tender, non distended  Musculoskeletal: 1+ bilateral lower extremity edema Skin: RUE av fistula +bruit and thrill  Recent Labs  Lab 04/17/20 0356 04/17/20 2157 04/18/20 0403  NA 139 143 141  K 4.6 3.8 4.0  CL 95* 97* 96*  CO2 25 31 25   BUN 84* 53* 59*  CREATININE 13.42* 9.14* 9.86*  GLUCOSE 261* 155* 156*   Recent Labs  Lab 04/17/20 0356 04/17/20 2157 04/18/20 0403  HGB 13.0 13.0 12.3  HCT 37.3 37.9 37.3  WBC 7.2 11.3* 11.9*  PLT 363 393 344   DG Abd 1 View  Result Date: 05/14/2020 CLINICAL DATA:  NG tube placement EXAM: ABDOMEN - 1 VIEW COMPARISON:  None. FINDINGS: NG tube is in the stomach. Left pneumothorax seen at the left lung base. Airspace disease throughout the lungs. IMPRESSION: NG tube in the stomach. Moderate-sized left pneumothorax. Critical Value/emergent results were called by telephone at the time of interpretation on 2020-05-14 at 1:57 am to nurse Mordecai Maes , who verbally acknowledged these results. Electronically Signed   By: Rolm Baptise M.D.   On: 05/14/2020 02:00   Portable Chest x-ray  Result Date: 05-14-2020 CLINICAL DATA:  Intubation EXAM: PORTABLE CHEST 1 VIEW COMPARISON:  04/17/2020 FINDINGS: Endotracheal tube is in  the right mainstem bronchus. Recommend retracting approximately 5 cm. Moderate-sized left pneumothorax, new since prior study, approximately 20 to 25%. Patchy bilateral airspace disease, somewhat improved since prior study. IMPRESSION: New moderate-sized left pneumothorax. Right mainstem intubation. Recommend retracting the endotracheal tube approximately 5 cm. Critical Value/emergent results were called by telephone at the time of interpretation on 05-14-20 at 2:02 am to nurse Mordecai Maes , who verbally acknowledged these results. Electronically Signed   By: Rolm Baptise M.D.   On: May 14, 2020 02:03   DG Chest Port 1 View  Result Date: 04/17/2020 CLINICAL DATA:  Acute respiratory failure with hypoxia EXAM: PORTABLE CHEST 1 VIEW COMPARISON:  04/15/2020 and prior exams FINDINGS: The cardiomediastinal silhouette is unchanged with enlarged cardiopericardial silhouette. Bilateral airspace opacities are again noted and appear slightly decreased on the RIGHT. This is a low volume study.  No pneumothorax. No other interval changes noted. IMPRESSION: Slightly decreased RIGHT airspace opacities otherwise unchanged appearance of the chest. No pneumothorax. Electronically Signed   By: Margarette Canada M.D.   On: 04/17/2020 05:41   ECHOCARDIOGRAM COMPLETE  Result Date: 04/18/2020    ECHOCARDIOGRAM REPORT   Patient Name:   BERKLEE BATTEY Date of Exam: 04/18/2020 Medical Rec #:  [id number]      Height:       61.0 in Accession #:  [id number]     Weight:       175.9 lb Date of Birth:  15-Jan-1956      BSA:          1.789 m Patient Age:    1 years       BP:           135/62 mmHg Patient Gender: F              HR:           107 bpm. Exam Location:  [facility] Procedure: 2D Echo Indications:     Abnormal EKG  History:         Patient has no prior history of Echocardiogram examinations.                  Risk Factors:ESRD.  Sonographer:     Carlean Jews Thornton-Maynard Referring Phys:  [id number] VOJJKK BRESCIA Diagnosing Phys: Kate Sable MD IMPRESSIONS   1. Left ventricular ejection fraction, by estimation, is 50 to 55%. The left ventricle has low normal function. The left ventricle has no regional wall motion abnormalities. There is mild left ventricular hypertrophy. Left ventricular diastolic parameters are consistent with Grade II diastolic dysfunction (pseudonormalization).  2. Right ventricular systolic function is normal. The right ventricular size is normal.  3. Left atrial size was moderately dilated.  4. Right atrial size was mildly dilated.  5. The mitral valve is degenerative. Mild mitral valve regurgitation.  6. The aortic valve was not well visualized. Aortic valve regurgitation is not visualized. Mild aortic valve sclerosis is present, with no evidence of aortic valve stenosis.  7. The inferior vena cava is normal in size with greater than 50% respiratory variability, suggesting right atrial pressure of 3 mmHg. FINDINGS  Left Ventricle: Left ventricular ejection fraction, by estimation, is 50 to 55%. The left ventricle has low normal function. The left ventricle has no regional wall motion abnormalities. The left ventricular internal cavity size was normal in size. There is mild left ventricular hypertrophy. Left ventricular diastolic parameters are consistent with Grade II diastolic dysfunction (pseudonormalization). Right Ventricle: The right ventricular size is normal. No increase in right ventricular wall thickness. Right ventricular systolic function is normal. Left Atrium: Left atrial size was moderately dilated. Right Atrium: Right atrial size was mildly dilated. Pericardium: There is no evidence of pericardial effusion. Mitral Valve: The mitral valve is degenerative in appearance. Mild mitral annular calcification. Mild mitral valve regurgitation. Tricuspid Valve: The tricuspid valve is normal in structure. Tricuspid valve regurgitation is not demonstrated. Aortic Valve: The aortic valve was not well visualized. Aortic valve regurgitation is  not visualized. Mild aortic valve sclerosis is present, with no evidence of aortic valve stenosis. Aortic valve mean gradient measures 15.0 mmHg. Aortic valve peak gradient measures 29.6 mmHg. Aortic valve area, by VTI measures 1.18 cm. Pulmonic Valve: The pulmonic valve was not well visualized. Pulmonic valve regurgitation is not visualized. Aorta: The aortic root is normal in size and structure. Venous: The inferior vena cava is normal in size with greater than 50% respiratory variability, suggesting right atrial pressure of 3 mmHg. IAS/Shunts: No atrial level shunt detected by color flow Doppler.  LEFT VENTRICLE PLAX 2D LVIDd:         4.08 cm  Diastology LVIDs:         2.58 cm  LV e' lateral:   5.19 cm/s LV PW:         1.56 cm  LV E/e' lateral: 16.8 LV IVS:  1.66 cm  LV e' medial:    3.98 cm/s LVOT diam:     2.00 cm  LV E/e' medial:  21.9 LV SV:         41 LV SV Index:   23 LVOT Area:     3.14 cm  RIGHT VENTRICLE RV S prime:     13.20 cm/s TAPSE (M-mode): 1.9 cm LEFT ATRIUM             Index LA diam:        3.70 cm 2.07 cm/m LA Vol (A2C):   80.7 ml 45.12 ml/m LA Vol (A4C):   84.5 ml 47.24 ml/m LA Biplane Vol: 87.4 ml 48.87 ml/m  AORTIC VALVE                    PULMONIC VALVE AV Area (Vmax):    1.19 cm     PV Vmax:       1.40 m/s AV Area (Vmean):   1.28 cm     PV Peak grad:  7.8 mmHg AV Area (VTI):     1.18 cm AV Vmax:           272.00 cm/s AV Vmean:          177.000 cm/s AV VTI:            0.348 m AV Peak Grad:      29.6 mmHg AV Mean Grad:      15.0 mmHg LVOT Vmax:         103.00 cm/s LVOT Vmean:        71.900 cm/s LVOT VTI:          0.131 m LVOT/AV VTI ratio: 0.38  AORTA Ao Root diam: 3.20 cm MITRAL VALVE MV Area (PHT): 4.17 cm    SHUNTS MV Decel Time: 182 msec    Systemic VTI:  0.13 m MV E velocity: 87.00 cm/s  Systemic Diam: 2.00 cm MV A velocity: 74.10 cm/s MV E/A ratio:  1.17 Kate Sable MD Electronically signed by Kate Sable MD Signature Date/Time: 04/18/2020/12:55:43 PM    Final      ASSESSMENT / PLAN:  Acute hypoxic hypercapnic respiratory failure  COVID-19 Volume overload/pulmonary edema Morbid obesity with COVID-19 New left pneumothorax  Failed HHFNC or NIV and had to be emergently intubated. VAP protocol Full vent support with current settings As needed ABG and chest x-ray Consider CT surgery consult for chest tube placement for worsening hypoxia suggestive of worsening pneumothorax Continue remdesivir Presently on solumedrol 40mg  IV q 12h, will maintain  Trend inflammatory markers for need of baricitinib  Prn bronchodilator therapy Pulmonary toilet Aggressive pulmonary hygiene  Malignant HTN Elevated troponin likely secondary to demand ischemia in the setting of acute respiratory failure  Hx: Cardiomyopathy Continuous telemetry monitoring  Hold outpatient antihypertensives in light of hypothermia Troponin peaked 324 on 8/31, now 165  Will check echo, no acute ST segments correlating with peak  ESRD on hemodialysis  Trend BMP  Replace electrolytes as indicated  Avoid nephrotoxic medications D/W nephrology, successful with iHD, K and bicarb are WNL   Anemia without obvious signs of acute blood loss VTE px: subq heparin and SCD's  Trend CBC  Monitor s/sx of bleeding and transfuse hgb <7  Type II diabetes mellitus  CBG's ac/hs SSI   Acute encephalopathy likely secondary to infectious process  Frequent reorientation  Avoid sedating medications   ID  Leukocytosis accruing with bandemia,  will pan-culture and monitor temp (afebrile) Send PCT  Plan of care discussed with covering attending, Elink MD, and PCP present  Lamiyah Schlotter S. [facility] ANP-BC Pulmonary and Critical Care Medicine [facility] Pager [phone] or [phone]  NB: This document was prepared using Dragon voice recognition software and may include unintentional dictation errors.

## 2020-05-15 DEATH — deceased

## 2021-02-25 IMAGING — DX DG CHEST 1V PORT
2 series · 2 of 2 positions shown · non-contrast
Comparison: 04/15/2020 and prior exams

CLINICAL DATA: Acute respiratory failure with hypoxia

EXAM:
PORTABLE CHEST 1 VIEW

[chest ap (1 of 2)]
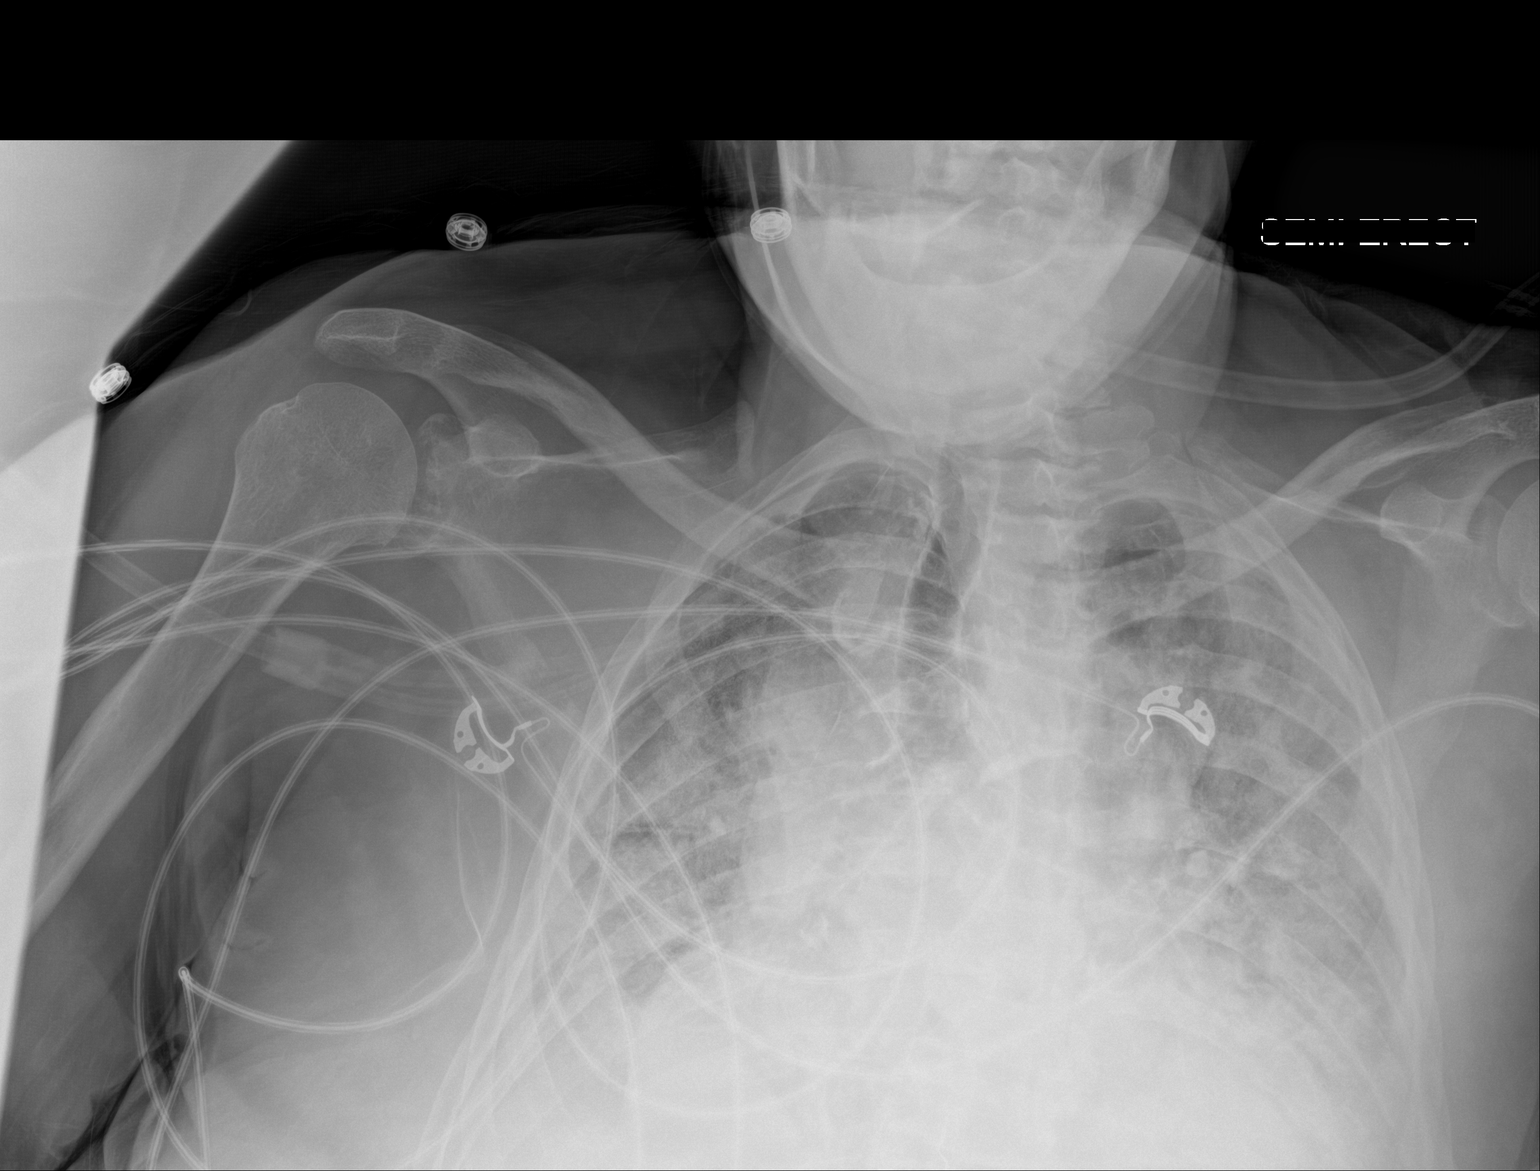

[chest ap (2 of 2)]
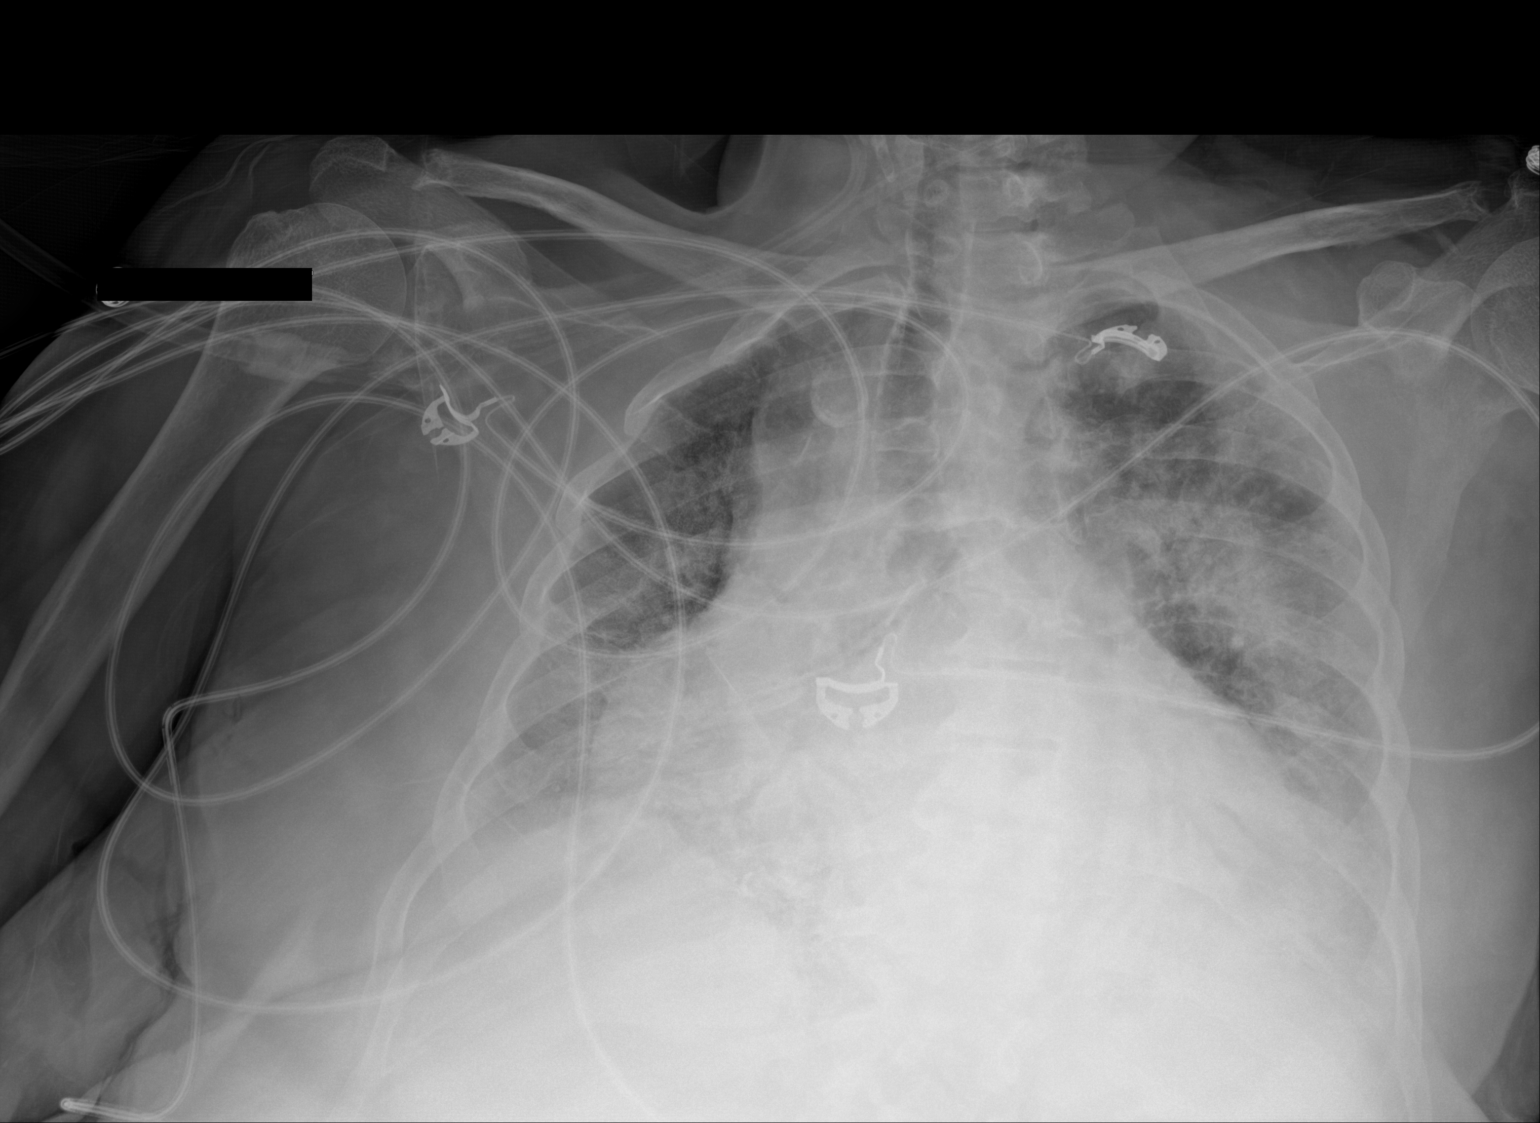

[2 of 2 positions shown; findings below may reference images not displayed]

FINDINGS: The cardiomediastinal silhouette is unchanged with enlarged
cardiopericardial silhouette.

Bilateral airspace opacities are again noted and appear slightly
decreased on the RIGHT.

This is a low volume study.  No pneumothorax.

No other interval changes noted.
IMPRESSION: Slightly decreased RIGHT airspace opacities otherwise unchanged
appearance of the chest. No pneumothorax.

## 2021-02-27 IMAGING — DX DG ABDOMEN 1V
1 series · 1 of 1 positions shown · non-contrast
Comparison: None.

CLINICAL DATA: NG tube placement

EXAM:
ABDOMEN - 1 VIEW

[abdomen supine]
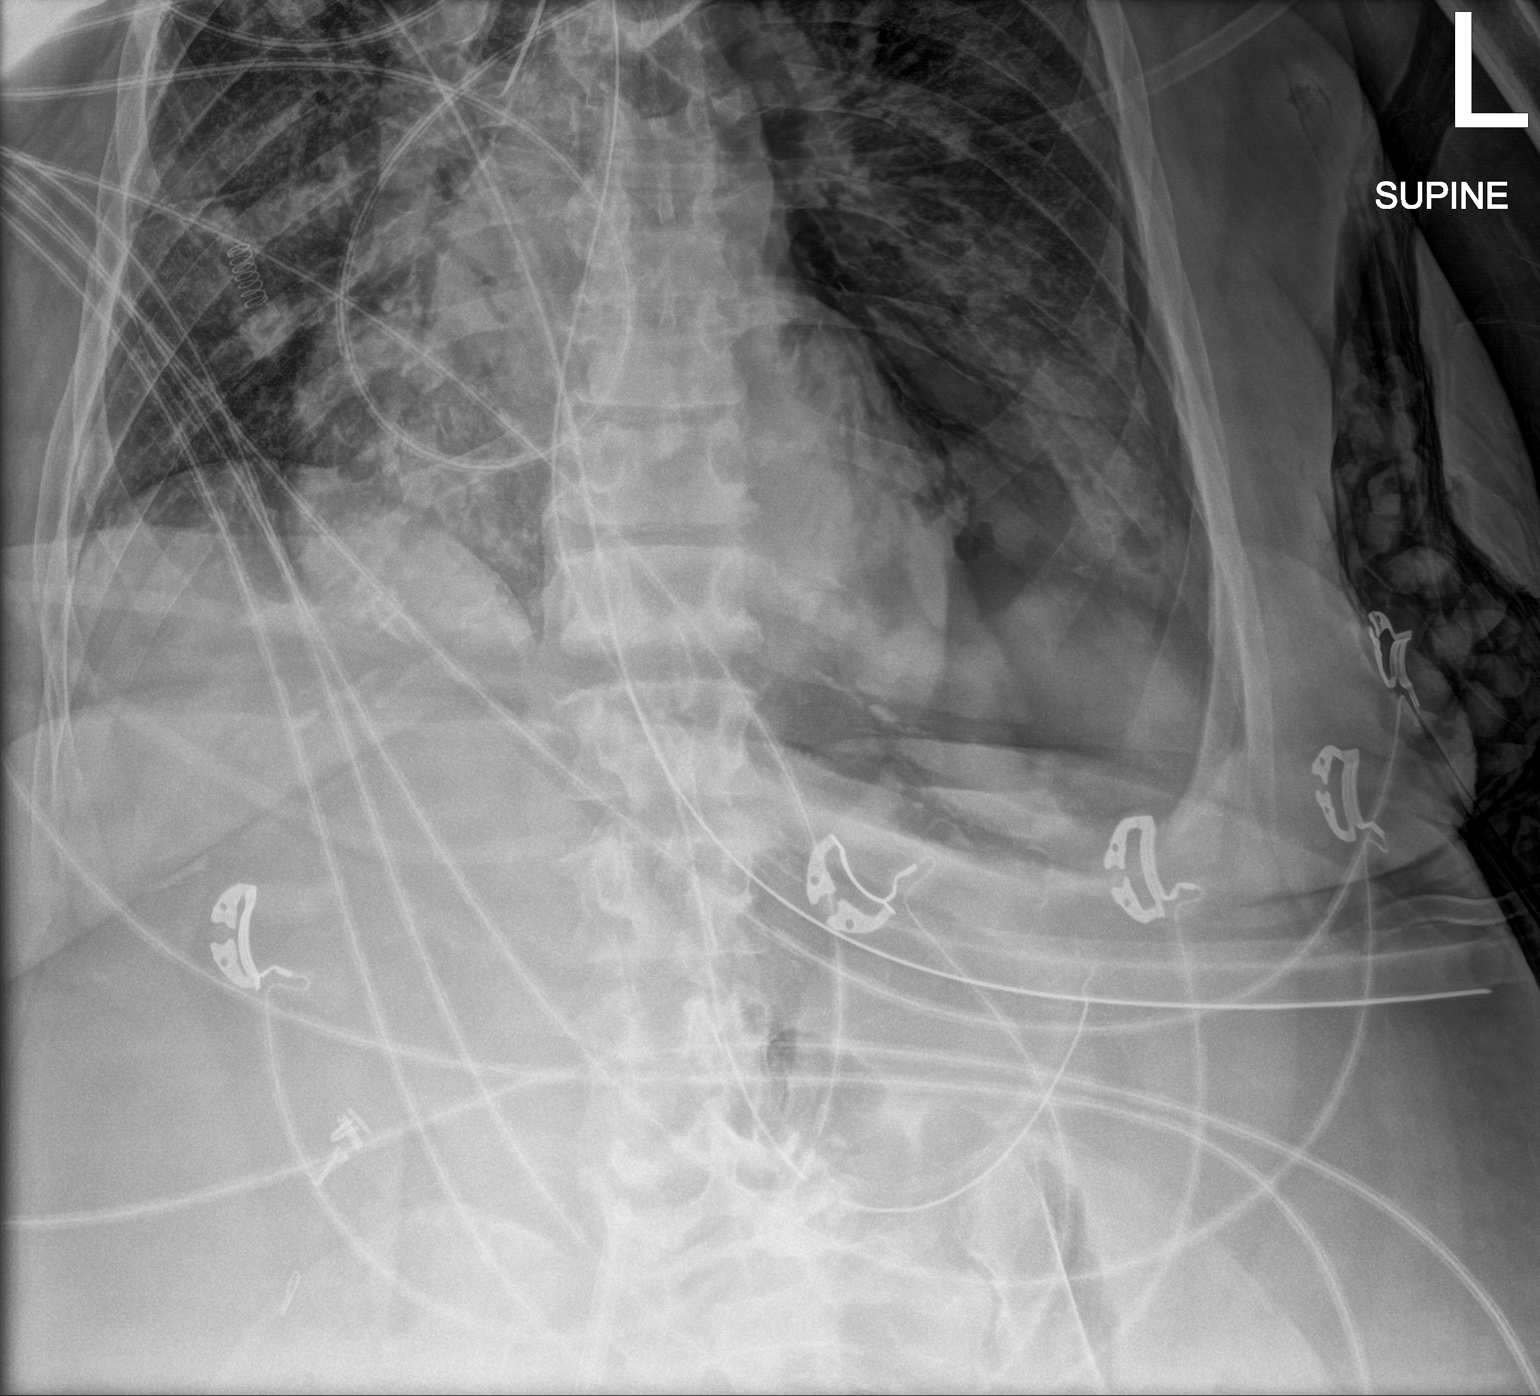

[1 of 1 positions shown; findings below may reference images not displayed]

FINDINGS: NG tube is in the stomach. Left pneumothorax seen at the left lung
base. Airspace disease throughout the lungs.
IMPRESSION: NG tube in the stomach.

Moderate-sized left pneumothorax.

Critical Value/emergent results were called by telephone at the time
of interpretation on 04/19/2020 at [DATE] to nurse Muhammadsoli , who
verbally acknowledged these results.

## 2021-02-27 IMAGING — DX DG CHEST 1V PORT
1 series · 1 of 1 positions shown · non-contrast
Comparison: 04/19/2020

CLINICAL DATA: Intubation

EXAM:
PORTABLE CHEST 1 VIEW

[chest ap]
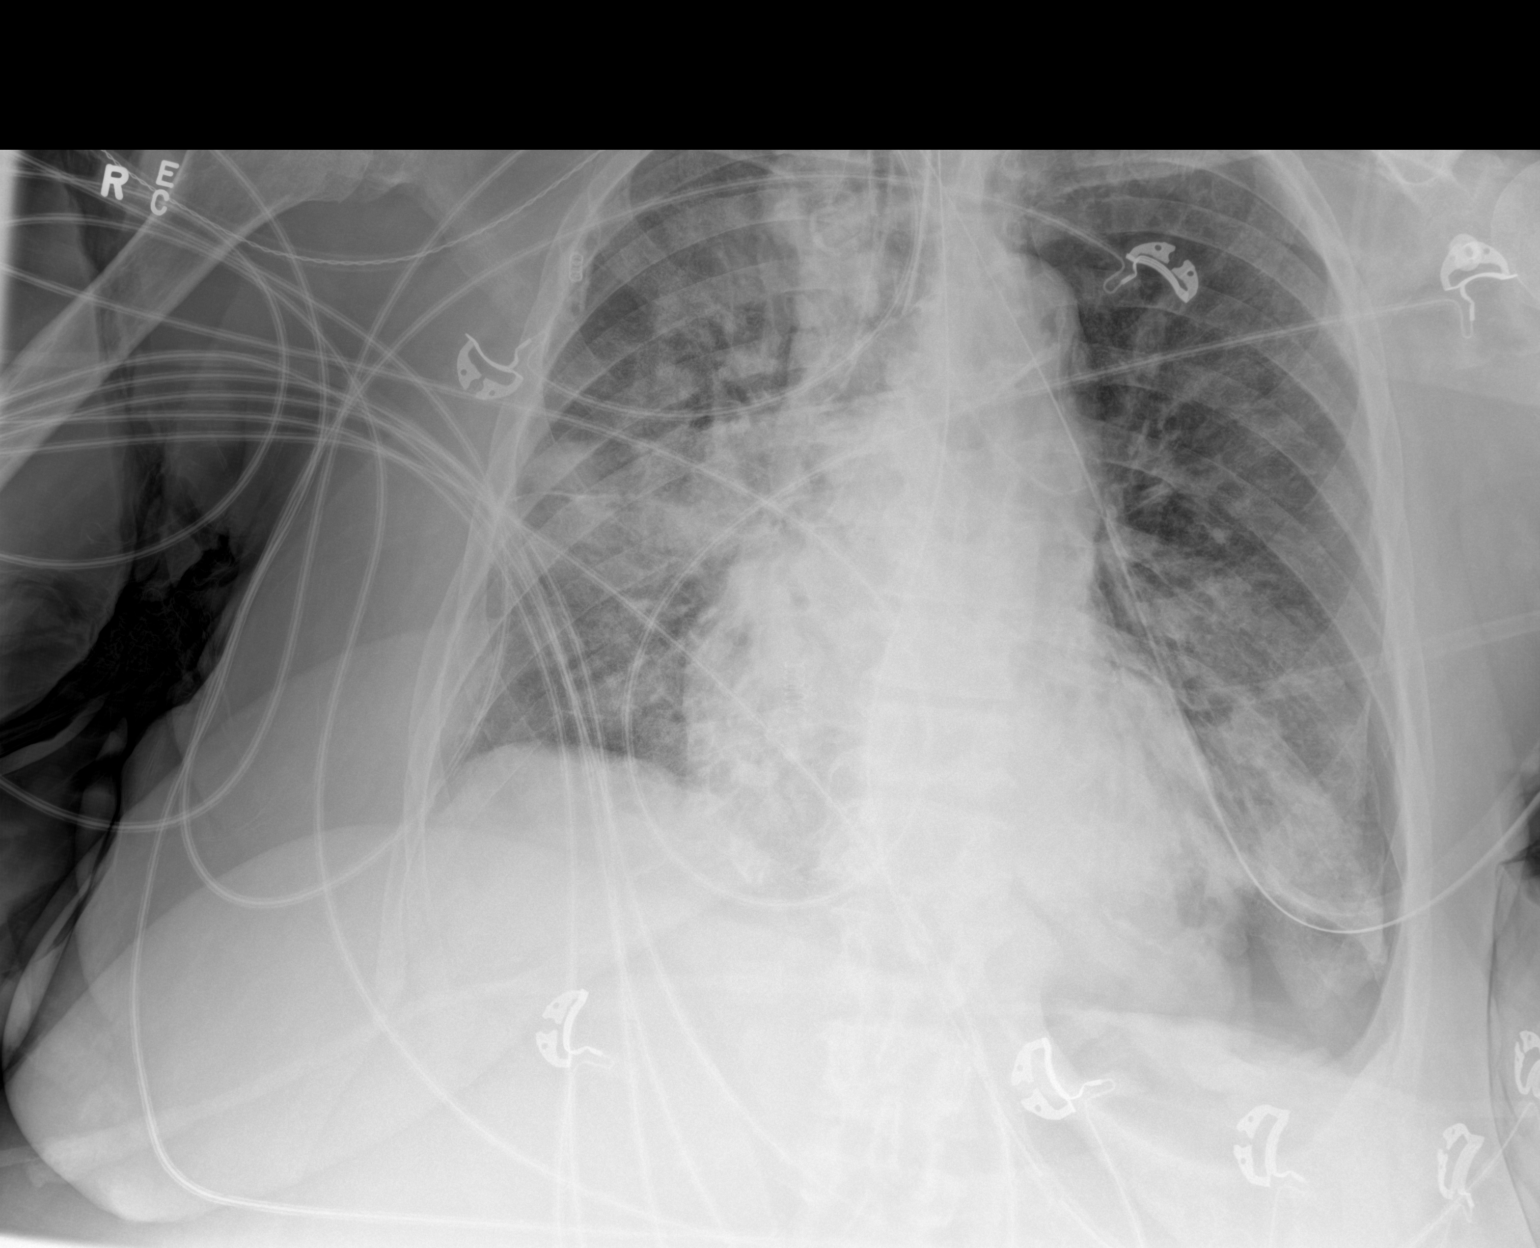

[1 of 1 positions shown; findings below may reference images not displayed]

FINDINGS: Endotracheal tube has been retracted and is now approximately 2 cm
above the carina. Moderate-sized left pneumothorax again noted.
Patchy bilateral airspace disease. Heart is borderline in size.
IMPRESSION: Endotracheal tube now 2 cm above the carina.

Stable moderate right pneumothorax and bilateral airspace disease.

## 2021-02-27 IMAGING — DX DG CHEST 1V PORT
1 series · 1 of 1 positions shown · non-contrast
Comparison: 04/17/2020

CLINICAL DATA: Intubation

EXAM:
PORTABLE CHEST 1 VIEW

[chest ap]
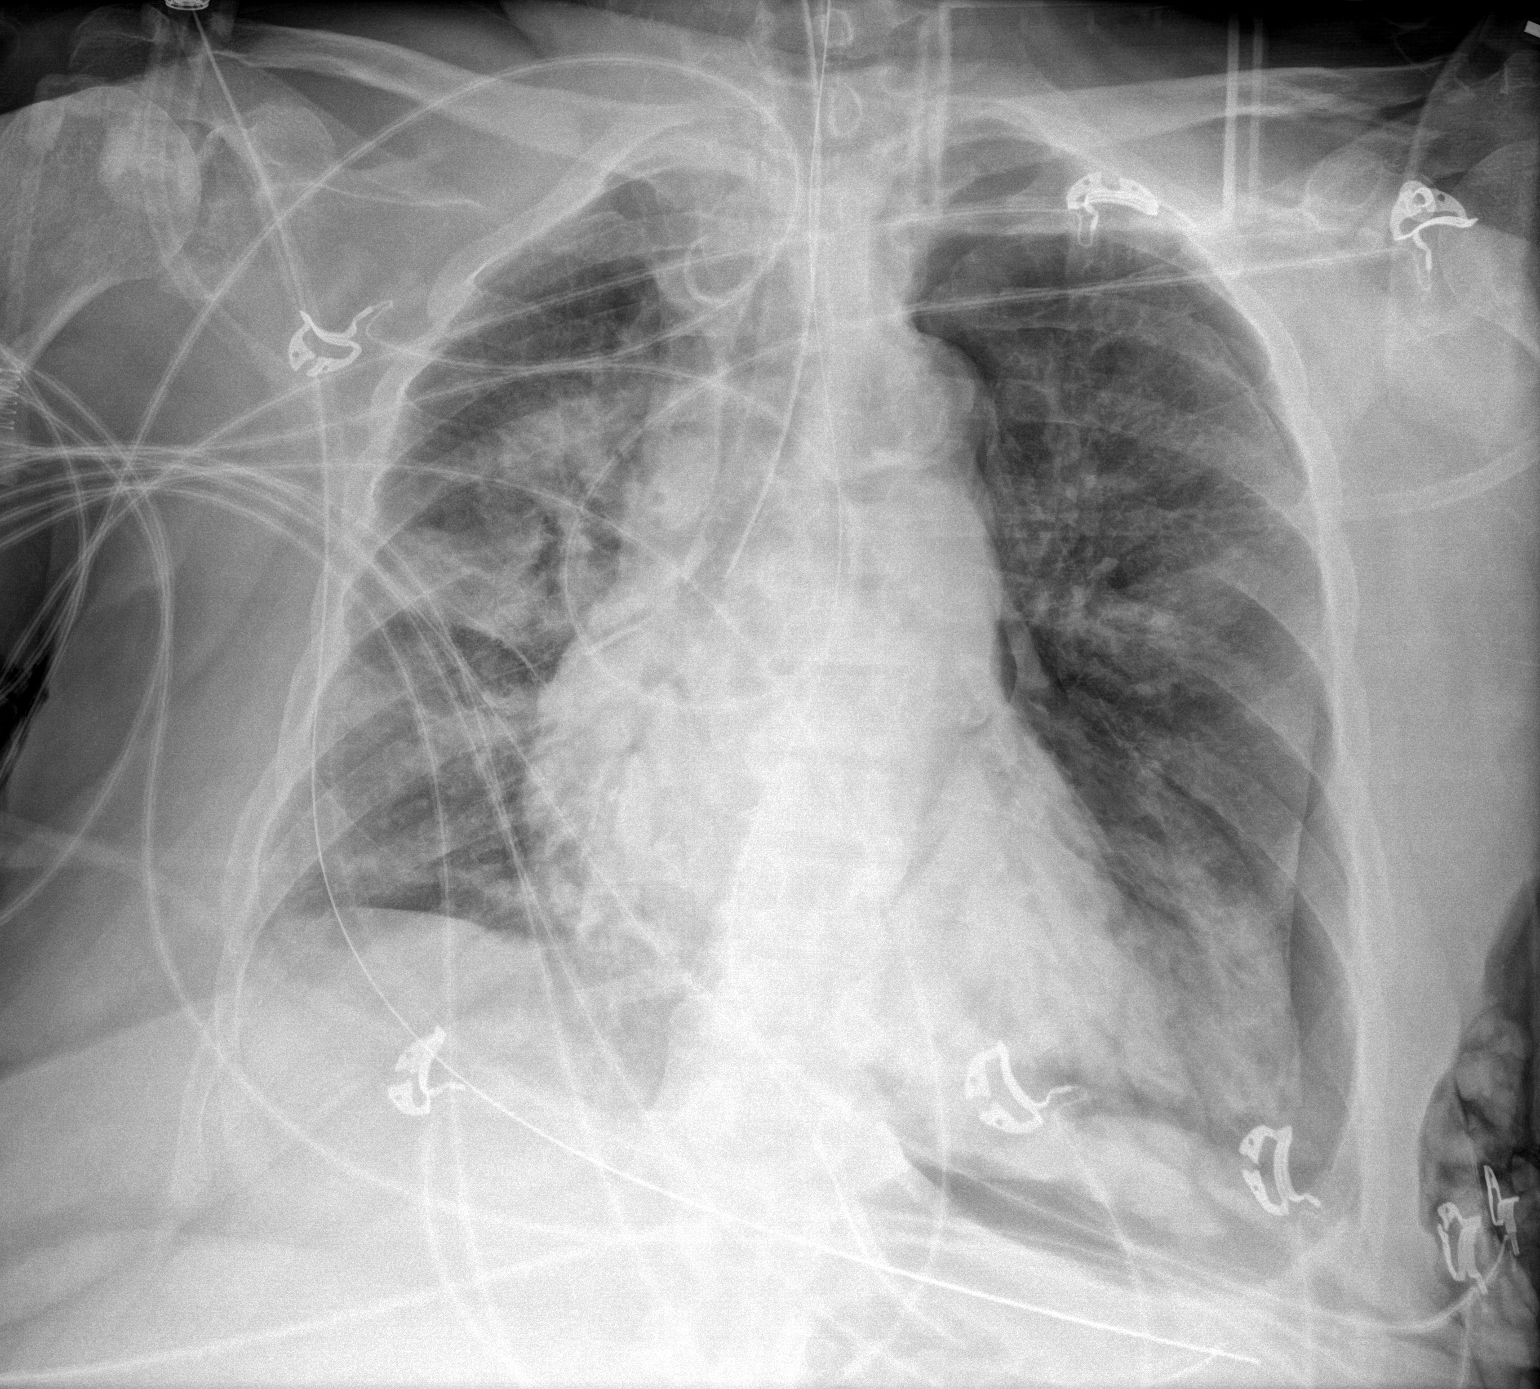

[1 of 1 positions shown; findings below may reference images not displayed]

FINDINGS: Endotracheal tube is in the right mainstem bronchus. Recommend
retracting approximately 5 cm. Moderate-sized left pneumothorax, new
since prior study, approximately 20 to 25%. Patchy bilateral
airspace disease, somewhat improved since prior study.
IMPRESSION: New moderate-sized left pneumothorax.

Right mainstem intubation. Recommend retracting the endotracheal
tube approximately 5 cm.

Critical Value/emergent results were called by telephone at the time
of interpretation on 04/19/2020 at [DATE] to nurse Festusaubrey , who
verbally acknowledged these results.
